# Patient Record
Sex: Male | Born: 1985 | Race: Black or African American | Hispanic: No | Marital: Married | Smoking: Never smoker
Health system: Southern US, Community
[De-identification: ages and names within clinical notes are randomized; demographics above are authoritative.]

## PROBLEM LIST (undated history)

## (undated) ENCOUNTER — Ambulatory Visit: Admission: EM | Payer: 59

## (undated) DIAGNOSIS — K219 Gastro-esophageal reflux disease without esophagitis: Secondary | ICD-10-CM

## (undated) DIAGNOSIS — J45909 Unspecified asthma, uncomplicated: Secondary | ICD-10-CM

## (undated) DIAGNOSIS — T7840XA Allergy, unspecified, initial encounter: Secondary | ICD-10-CM

## (undated) HISTORY — DX: Gastro-esophageal reflux disease without esophagitis: K21.9

## (undated) HISTORY — DX: Allergy, unspecified, initial encounter: T78.40XA

---
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
## 2006-04-12 ENCOUNTER — Emergency Department: Payer: Self-pay | Admitting: General Practice

## 2006-06-16 ENCOUNTER — Emergency Department: Payer: Self-pay | Admitting: Emergency Medicine

## 2006-06-16 ENCOUNTER — Other Ambulatory Visit: Payer: Self-pay

## 2006-06-29 ENCOUNTER — Emergency Department: Payer: Self-pay | Admitting: General Practice

## 2006-10-01 ENCOUNTER — Emergency Department: Payer: Self-pay | Admitting: Emergency Medicine

## 2007-01-28 ENCOUNTER — Emergency Department: Payer: Self-pay | Admitting: Emergency Medicine

## 2007-08-03 ENCOUNTER — Emergency Department: Payer: Self-pay | Admitting: Emergency Medicine

## 2007-08-07 ENCOUNTER — Emergency Department: Payer: Self-pay | Admitting: Emergency Medicine

## 2007-09-30 ENCOUNTER — Emergency Department: Payer: Self-pay | Admitting: Unknown Physician Specialty

## 2007-11-18 ENCOUNTER — Emergency Department: Payer: Self-pay

## 2007-11-22 ENCOUNTER — Emergency Department: Payer: Self-pay | Admitting: Emergency Medicine

## 2007-12-14 ENCOUNTER — Emergency Department: Payer: Self-pay | Admitting: Emergency Medicine

## 2008-04-29 ENCOUNTER — Emergency Department: Payer: Self-pay | Admitting: Emergency Medicine

## 2009-10-03 ENCOUNTER — Emergency Department: Payer: Self-pay | Admitting: Emergency Medicine

## 2010-02-20 ENCOUNTER — Emergency Department: Payer: Self-pay | Admitting: Emergency Medicine

## 2010-09-23 ENCOUNTER — Emergency Department: Payer: Self-pay | Admitting: Emergency Medicine

## 2010-09-30 ENCOUNTER — Emergency Department: Payer: Self-pay | Admitting: Emergency Medicine

## 2011-04-29 ENCOUNTER — Emergency Department: Payer: Self-pay | Admitting: Emergency Medicine

## 2011-05-23 ENCOUNTER — Emergency Department: Payer: Self-pay | Admitting: Internal Medicine

## 2011-12-03 ENCOUNTER — Emergency Department: Payer: Self-pay | Admitting: Emergency Medicine

## 2012-06-02 ENCOUNTER — Emergency Department: Payer: Self-pay | Admitting: Emergency Medicine

## 2012-06-02 LAB — URINALYSIS, COMPLETE
Bilirubin,UR: NEGATIVE
Glucose,UR: NEGATIVE mg/dL (ref 0–75)
Ketone: NEGATIVE
Leukocyte Esterase: NEGATIVE
Ph: 5 (ref 4.5–8.0)
RBC,UR: 1 /HPF (ref 0–5)
Squamous Epithelial: NONE SEEN
WBC UR: 4 /HPF (ref 0–5)

## 2012-06-06 ENCOUNTER — Emergency Department: Payer: Self-pay | Admitting: *Deleted

## 2012-09-19 ENCOUNTER — Emergency Department: Payer: Self-pay | Admitting: Emergency Medicine

## 2012-09-21 LAB — BETA STREP CULTURE(ARMC)

## 2013-03-18 ENCOUNTER — Emergency Department: Payer: Self-pay | Admitting: Emergency Medicine

## 2013-07-03 ENCOUNTER — Emergency Department: Payer: Self-pay | Admitting: Emergency Medicine

## 2013-07-09 ENCOUNTER — Emergency Department: Payer: Self-pay | Admitting: Emergency Medicine

## 2013-07-14 ENCOUNTER — Emergency Department: Payer: Self-pay | Admitting: Emergency Medicine

## 2013-08-09 ENCOUNTER — Emergency Department: Payer: Self-pay | Admitting: Emergency Medicine

## 2013-08-25 ENCOUNTER — Emergency Department: Payer: Self-pay | Admitting: Emergency Medicine

## 2013-09-24 ENCOUNTER — Emergency Department: Payer: Self-pay | Admitting: Emergency Medicine

## 2013-10-31 ENCOUNTER — Emergency Department: Payer: Self-pay | Admitting: Emergency Medicine

## 2013-11-20 ENCOUNTER — Emergency Department: Payer: Self-pay | Admitting: Internal Medicine

## 2013-11-29 ENCOUNTER — Emergency Department: Payer: Self-pay | Admitting: Emergency Medicine

## 2013-12-02 ENCOUNTER — Emergency Department: Payer: Self-pay | Admitting: Emergency Medicine

## 2013-12-21 ENCOUNTER — Emergency Department: Payer: Self-pay | Admitting: Emergency Medicine

## 2014-01-02 ENCOUNTER — Emergency Department: Payer: Self-pay | Admitting: Emergency Medicine

## 2014-01-13 ENCOUNTER — Emergency Department: Payer: Self-pay | Admitting: Emergency Medicine

## 2014-02-04 ENCOUNTER — Emergency Department: Payer: Self-pay | Admitting: Emergency Medicine

## 2014-02-08 ENCOUNTER — Emergency Department: Payer: Self-pay | Admitting: Emergency Medicine

## 2014-02-11 LAB — BETA STREP CULTURE(ARMC)

## 2014-02-25 ENCOUNTER — Emergency Department: Payer: Self-pay | Admitting: Emergency Medicine

## 2014-03-16 ENCOUNTER — Emergency Department: Payer: Self-pay | Admitting: Emergency Medicine

## 2014-04-23 ENCOUNTER — Emergency Department: Payer: Self-pay | Admitting: Emergency Medicine

## 2014-06-21 ENCOUNTER — Emergency Department: Payer: Self-pay | Admitting: Internal Medicine

## 2014-08-01 ENCOUNTER — Emergency Department: Payer: Self-pay | Admitting: Emergency Medicine

## 2014-10-20 ENCOUNTER — Emergency Department: Payer: Self-pay | Admitting: Emergency Medicine

## 2014-10-23 ENCOUNTER — Emergency Department: Payer: Self-pay | Admitting: Emergency Medicine

## 2014-12-13 ENCOUNTER — Emergency Department: Payer: Self-pay | Admitting: Student

## 2014-12-15 ENCOUNTER — Emergency Department: Payer: Self-pay | Admitting: Emergency Medicine

## 2015-01-04 ENCOUNTER — Emergency Department: Payer: Self-pay | Admitting: Emergency Medicine

## 2015-01-16 ENCOUNTER — Emergency Department: Payer: Self-pay | Admitting: Emergency Medicine

## 2015-02-28 ENCOUNTER — Emergency Department
Admission: EM | Admit: 2015-02-28 | Discharge: 2015-03-01 | Disposition: A | Discharge status: Home / Self Care | Payer: Self-pay | Attending: Emergency Medicine | Admitting: Emergency Medicine

## 2015-02-28 DIAGNOSIS — B349 Viral infection, unspecified: Secondary | ICD-10-CM | POA: Insufficient documentation

## 2015-02-28 DIAGNOSIS — J029 Acute pharyngitis, unspecified: Secondary | ICD-10-CM

## 2015-02-28 MED ORDER — PREDNISONE 20 MG PO TABS
60.0000 mg | ORAL_TABLET | Freq: Once | ORAL | Status: AC
  Administered 2015-03-01: 60 mg via ORAL

## 2015-02-28 MED ORDER — KETOROLAC TROMETHAMINE 10 MG PO TABS
10.0000 mg | ORAL_TABLET | Freq: Once | ORAL | Status: AC
  Administered 2015-03-01: 10 mg via ORAL

## 2015-02-28 MED ORDER — HYDROXYZINE HCL 50 MG PO TABS
50.0000 mg | ORAL_TABLET | Freq: Once | ORAL | Status: AC
  Administered 2015-03-01: 50 mg via ORAL

## 2015-02-28 MED ORDER — LORATADINE-PSEUDOEPHEDRINE ER 5-120 MG PO TB12: 1.0000 | ORAL_TABLET | Freq: Two times a day (BID) | ORAL | Status: DC

## 2015-02-28 MED ORDER — HYDROXYZINE HCL 25 MG PO TABS: 25.0000 mg | ORAL_TABLET | Freq: Three times a day (TID) | ORAL | Status: DC | PRN

## 2015-02-28 MED ORDER — PREDNISONE 20 MG PO TABS: 40.0000 mg | ORAL_TABLET | Freq: Every day | ORAL | Status: DC

## 2015-02-28 NOTE — ED Notes (Addendum)
Patient ambulatory to triage with steady gait, without difficulty or distress noted; pt reports bit by spider 2 days ago; now with sneezing and swelling to nose, "feel like I got a cold"; pt denies seeing a spider but st "my fiance seen a bumb on my back and squeezed the pus out and it had two spots like fangs"; no area noted on back where pt indicates

## 2015-02-28 NOTE — ED Provider Notes (Signed)
CSN: [id number]     Arrival date & time 02/28/15  2201 History   First MD Initiated Contact with Patient 02/28/15 2242     Chief Complaint  Patient presents with  . Nasal Congestion     (Consider location/radiation/quality/duration/timing/severity/associated sxs/prior Treatment) HPI  history of present illness on this patient reveals that he was bitten by a spider 2 days ago is now having what he believes to be unrelated sneezing sinus pain and drainage states he feels like he has a cold thinks it may be his allergies denies fever chills nausea vomiting diarrhea or any other associated signs or symptoms nothing making anything better or worse  No past medical history on file. No past surgical history on file. No family history on file. History  Substance Use Topics  . Smoking status: Not on file  . Smokeless tobacco: Not on file  . Alcohol Use: Not on file    Review of Systems  Review of systems negative 6 systems are reviewed the patient's upper noted in history of present illness denies nausea vomiting diarrhea urinary symptoms skin rash anxiety or distress    Allergies  Review of patient's allergies indicates not on file.  Home Medications   Prior to Admission medications   Medication Sig Start Date End Date Taking? Authorizing Provider  hydrOXYzine (ATARAX/VISTARIL) 25 MG tablet Take 1 tablet (25 mg total) by mouth every 8 (eight) hours as needed. 02/28/15   Ranee Peasley William C Makilah Dowda, PA-C  loratadine-pseudoephedrine (CLARITIN-D 12 HOUR) 5-120 MG per tablet Take 1 tablet by mouth 2 (two) times daily. 02/28/15   Glayds Insco William C Cuinn Westerhold, PA-C  predniSONE (DELTASONE) 20 MG tablet Take 2 tablets (40 mg total) by mouth daily. 02/28/15 02/28/16  Jencarlos Nicolson William C Estella Malatesta, PA-C   BP 140/85 mmHg  Pulse 87  Temp(Src) 97.7 F (36.5 C) (Oral)  Ht 6' (1.829 m)  Wt 204 lb (92.534 kg)  BMI 27.66 kg/m2  SpO2 100% Physical Exam  Male appearing stated well-developed well-nourished in no acute  distress  Is as noted ago exam reveals frontal maxillary sinus tenderness postnasal drainage Pupils equal round reactive light accommodation extraocular motions are intact Cardiovascular regular rate and rhythm no murmurs rubs gallops pulmonary lungs clear to auscultation bilaterally Skin unremarkable for rash warm dry and intact Neuro exams nonfocal Psychiatric patient is acting appropriately  ED Course  Procedures   ED course patient was given antihistamines and steroids will be discharged home follow up with his doctor as needed MDM   Final diagnoses:  Viral syndrome  Pharyngitis        Wilkes Potvin Rosalyn GessWilliam C Zylie Mumaw, PA-C 02/28/15 2314  Sharyn CreamerMark Quale, MD 03/04/15 1656

## 2015-03-01 MED ORDER — HYDROXYZINE HCL 25 MG PO TABS
ORAL_TABLET | ORAL | Status: AC
  Administered 2015-03-01: 50 mg via ORAL
  Filled 2015-03-01: qty 2

## 2015-03-01 MED ORDER — PREDNISONE 20 MG PO TABS
ORAL_TABLET | ORAL | Status: AC
  Filled 2015-03-01: qty 3

## 2015-03-01 MED ORDER — KETOROLAC TROMETHAMINE 10 MG PO TABS
ORAL_TABLET | ORAL | Status: AC
  Administered 2015-03-01: 10 mg via ORAL
  Filled 2015-03-01: qty 1

## 2015-05-26 DIAGNOSIS — R220 Localized swelling, mass and lump, head: Secondary | ICD-10-CM | POA: Insufficient documentation

## 2015-05-26 DIAGNOSIS — T360X5A Adverse effect of penicillins, initial encounter: Secondary | ICD-10-CM | POA: Insufficient documentation

## 2015-05-26 NOTE — ED Notes (Signed)
Patient reports taken 3 doses of penicillin prescribed by dentist and now his nose has swollen and running.  Also reports "weak" feeling.  Patient alert and oriented x3, no rash or respiratory distress noted in triage.

## 2015-05-27 ENCOUNTER — Emergency Department
Admission: EM | Admit: 2015-05-27 | Discharge: 2015-05-27 | Discharge status: Against Medical Advice | Payer: Self-pay | Attending: Emergency Medicine | Admitting: Emergency Medicine

## 2015-11-02 ENCOUNTER — Emergency Department
Admission: EM | Admit: 2015-11-02 | Discharge: 2015-11-03 | Disposition: A | Discharge status: Home / Self Care | Payer: BLUE CROSS/BLUE SHIELD | Attending: Emergency Medicine | Admitting: Emergency Medicine

## 2015-11-02 ENCOUNTER — Emergency Department: Payer: BLUE CROSS/BLUE SHIELD

## 2015-11-02 DIAGNOSIS — S56521A Laceration of other extensor muscle, fascia and tendon at forearm level, right arm, initial encounter: Secondary | ICD-10-CM

## 2015-11-02 DIAGNOSIS — Z7952 Long term (current) use of systemic steroids: Secondary | ICD-10-CM | POA: Diagnosis not present

## 2015-11-02 DIAGNOSIS — S61214A Laceration without foreign body of right ring finger without damage to nail, initial encounter: Secondary | ICD-10-CM | POA: Insufficient documentation

## 2015-11-02 DIAGNOSIS — S61212A Laceration without foreign body of right middle finger without damage to nail, initial encounter: Secondary | ICD-10-CM | POA: Diagnosis not present

## 2015-11-02 DIAGNOSIS — Z79899 Other long term (current) drug therapy: Secondary | ICD-10-CM | POA: Insufficient documentation

## 2015-11-02 DIAGNOSIS — Y998 Other external cause status: Secondary | ICD-10-CM | POA: Diagnosis not present

## 2015-11-02 DIAGNOSIS — Z23 Encounter for immunization: Secondary | ICD-10-CM | POA: Diagnosis not present

## 2015-11-02 DIAGNOSIS — Z880 Allergy status to penicillin: Secondary | ICD-10-CM | POA: Insufficient documentation

## 2015-11-02 DIAGNOSIS — S56427A Laceration of extensor muscle, fascia and tendon of right little finger at forearm level, initial encounter: Secondary | ICD-10-CM | POA: Diagnosis not present

## 2015-11-02 DIAGNOSIS — S61411A Laceration without foreign body of right hand, initial encounter: Secondary | ICD-10-CM

## 2015-11-02 DIAGNOSIS — S61210A Laceration without foreign body of right index finger without damage to nail, initial encounter: Secondary | ICD-10-CM | POA: Insufficient documentation

## 2015-11-02 DIAGNOSIS — Y9389 Activity, other specified: Secondary | ICD-10-CM | POA: Diagnosis not present

## 2015-11-02 DIAGNOSIS — S61219A Laceration without foreign body of unspecified finger without damage to nail, initial encounter: Secondary | ICD-10-CM

## 2015-11-02 DIAGNOSIS — Y92009 Unspecified place in unspecified non-institutional (private) residence as the place of occurrence of the external cause: Secondary | ICD-10-CM | POA: Insufficient documentation

## 2015-11-02 DIAGNOSIS — S56921A Laceration of unspecified muscles, fascia and tendons at forearm level, right arm, initial encounter: Secondary | ICD-10-CM

## 2015-11-02 DIAGNOSIS — W25XXXA Contact with sharp glass, initial encounter: Secondary | ICD-10-CM | POA: Insufficient documentation

## 2015-11-02 DIAGNOSIS — S51801A Unspecified open wound of right forearm, initial encounter: Secondary | ICD-10-CM

## 2015-11-02 MED ORDER — TETANUS-DIPHTH-ACELL PERTUSSIS 5-2.5-18.5 LF-MCG/0.5 IM SUSP
0.5000 mL | Freq: Once | INTRAMUSCULAR | Status: AC
  Administered 2015-11-03: 0.5 mL via INTRAMUSCULAR
  Filled 2015-11-02: qty 0.5

## 2015-11-02 MED ORDER — LIDOCAINE HCL (PF) 1 % IJ SOLN
5.0000 mL | Freq: Once | INTRAMUSCULAR | Status: AC
  Administered 2015-11-03: 5 mL
  Filled 2015-11-02: qty 5

## 2015-11-02 MED ORDER — TRAMADOL HCL 50 MG PO TABS
100.0000 mg | ORAL_TABLET | Freq: Once | ORAL | Status: DC
  Filled 2015-11-02: qty 2
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
## 2015-11-02 NOTE — ED Notes (Signed)
Pt in with lacerations x2  to hand with glass

## 2015-11-03 NOTE — Discharge Instructions (Signed)
Laceration Care, Adult °A laceration is a cut that goes through all layers of the skin. The cut also goes into the tissue that is right under the skin. Some cuts heal on their own. Others need to be closed with stitches (sutures), staples, skin adhesive strips, or wound glue. Taking care of your cut lowers your risk of infection and helps your cut to heal better. °HOW TO TAKE CARE OF YOUR CUT °For stitches or staples: °· Keep the wound clean and dry. °· If you were given a bandage (dressing), you should change it at least one time per day or as told by your doctor. You should also change it if it gets wet or dirty. °· Keep the wound completely dry for the first 24 hours or as told by your doctor. After that time, you may take a shower or a bath. However, make sure that the wound is not soaked in water until after the stitches or staples have been removed. °· Clean the wound one time each day or as told by your doctor: °· Wash the wound with soap and water. °· Rinse the wound with water until all of the soap comes off. °· Pat the wound dry with a clean towel. Do not rub the wound. °· After you clean the wound, put a thin layer of antibiotic ointment on it as told by your doctor. This ointment: °· Helps to prevent infection. °· Keeps the bandage from sticking to the wound. °· Have your stitches or staples removed as told by your doctor. °If your doctor used skin adhesive strips:  °· Keep the wound clean and dry. °· If you were given a bandage, you should change it at least one time per day or as told by your doctor. You should also change it if it gets dirty or wet. °· Do not get the skin adhesive strips wet. You can take a shower or a bath, but be careful to keep the wound dry. °· If the wound gets wet, pat it dry with a clean towel. Do not rub the wound. °· Skin adhesive strips fall off on their own. You can trim the strips as the wound heals. Do not remove any strips that are still stuck to the wound. They will  fall off after a while. °If your doctor used wound glue: °· Try to keep your wound dry, but you may briefly wet it in the shower or bath. Do not soak the wound in water, such as by swimming. °· After you take a shower or a bath, gently pat the wound dry with a clean towel. Do not rub the wound. °· Do not do any activities that will make you really sweaty until the skin glue has fallen off on its own. °· Do not apply liquid, cream, or ointment medicine to your wound while the skin glue is still on. °· If you were given a bandage, you should change it at least one time per day or as told by your doctor. You should also change it if it gets dirty or wet. °· If a bandage is placed over the wound, do not let the tape for the bandage touch the skin glue. °· Do not pick at the glue. The skin glue usually stays on for 5-10 days. Then, it falls off of the skin. °General Instructions  °· To help prevent scarring, make sure to cover your wound with sunscreen whenever you are outside after stitches are removed, after adhesive strips are removed,   or when wound glue stays in place and the wound is healed. Make sure to wear a sunscreen of at least 30 SPF. °· Take over-the-counter and prescription medicines only as told by your doctor. °· If you were given antibiotic medicine or ointment, take or apply it as told by your doctor. Do not stop using the antibiotic even if your wound is getting better. °· Do not scratch or pick at the wound. °· Keep all follow-up visits as told by your doctor. This is important. °· Check your wound every day for signs of infection. Watch for: °¨ Redness, swelling, or pain. °¨ Fluid, blood, or pus. °· Raise (elevate) the injured area above the level of your heart while you are sitting or lying down, if possible. °GET HELP IF: °· You got a tetanus shot and you have any of these problems at the injection site: °¨ Swelling. °¨ Very bad pain. °¨ Redness. °¨ Bleeding. °· You have a fever. °· A wound that was  closed breaks open. °· You notice a bad smell coming from your wound or your bandage. °· You notice something coming out of the wound, such as wood or glass. °· Medicine does not help your pain. °· You have more redness, swelling, or pain at the site of your wound. °· You have fluid, blood, or pus coming from your wound. °· You notice a change in the color of your skin near your wound. °· You need to change the bandage often because fluid, blood, or pus is coming from the wound. °· You start to have a new rash. °· You start to have numbness around the wound. °GET HELP RIGHT AWAY IF: °· You have very bad swelling around the wound. °· Your pain suddenly gets worse and is very bad. °· You notice painful lumps near the wound or on skin that is anywhere on your body. °· You have a red streak going away from your wound. °· The wound is on your hand or foot and you cannot move a finger or toe like you usually can. °· The wound is on your hand or foot and you notice that your fingers or toes look pale or bluish. °  °This information is not intended to replace advice given to you by your health care provider. Make sure you discuss any questions you have with your health care provider. °  °Document Released: 03/25/2008 Document Revised: 02/21/2015 Document Reviewed: 10/03/2014 °Elsevier Interactive Patient Education ©2016 Elsevier Inc. °Tendon Injury °Tendons are strong, cordlike structures that connect muscle to bone. Tendons are made up of woven fibers, like a rope. A tendon injury is a tear (rupture) of the tendon. The rupture may be partial (only a few of the fibers in your tendon rupture) or complete (your entire tendon ruptures). °CAUSES  °Tendon injuries can be caused by high-stress activities, such as sports. They also can be caused by a repetitive injury or by a single injury from an excessive, rapid force. °SYMPTOMS  °Symptoms of tendon injury include pain when you move the joint close to the tendon. Other symptoms  are swelling, redness, and warmth. °DIAGNOSIS  °Tendon injuries often can be diagnosed by physical exam. However, sometimes an X-ray exam or advanced imaging, such as magnetic resonance imaging (MRI), is necessary to determine the extent of the injury. °TREATMENT  °Partial tendon ruptures often can be treated with immobilization. A splint, bandage, or removable brace usually is used to immobilize the injured tendon. Most injured tendons need to   to be immobilized for 1-2 months before they are completely healed. Complete tendon ruptures may require surgical reattachment.   This information is not intended to replace advice given to you by your health care provider. Make sure you discuss any questions you have with your health care provider.   Document Released: 11/14/2004 Document Revised: 09/26/2011 Document Reviewed: 12/29/2011 Elsevier Interactive Patient Education [employer]2016 Elsevier Inc.   Keep the wounds clean, dry, and covered. [facility] and re-evaluation of the tendon injury in 2-3 days. [facility] in 10-days.

## 2015-11-14 ENCOUNTER — Ambulatory Visit: Payer: BLUE CROSS/BLUE SHIELD | Admitting: Anesthesiology

## 2015-11-14 ENCOUNTER — Encounter: Payer: Self-pay | Admitting: *Deleted

## 2015-11-14 ENCOUNTER — Ambulatory Visit
Admission: RE | Admit: 2015-11-14 | Discharge: 2015-11-14 | Disposition: A | Discharge status: Home / Self Care | Payer: BLUE CROSS/BLUE SHIELD | Source: Ambulatory Visit | Attending: Surgery | Admitting: Surgery

## 2015-11-14 ENCOUNTER — Encounter
Admission: RE | Disposition: A | Discharge status: Home / Self Care | Payer: Self-pay | Source: Ambulatory Visit | Attending: Surgery

## 2015-11-14 DIAGNOSIS — Z8349 Family history of other endocrine, nutritional and metabolic diseases: Secondary | ICD-10-CM | POA: Insufficient documentation

## 2015-11-14 DIAGNOSIS — S56427A Laceration of extensor muscle, fascia and tendon of right little finger at forearm level, initial encounter: Secondary | ICD-10-CM | POA: Insufficient documentation

## 2015-11-14 DIAGNOSIS — W1802XA Striking against glass with subsequent fall, initial encounter: Secondary | ICD-10-CM | POA: Diagnosis not present

## 2015-11-14 DIAGNOSIS — Z881 Allergy status to other antibiotic agents status: Secondary | ICD-10-CM | POA: Diagnosis not present

## 2015-11-14 DIAGNOSIS — S56423A Laceration of extensor muscle, fascia and tendon of right middle finger at forearm level, initial encounter: Secondary | ICD-10-CM | POA: Diagnosis present

## 2015-11-14 DIAGNOSIS — Z880 Allergy status to penicillin: Secondary | ICD-10-CM | POA: Insufficient documentation

## 2015-11-14 DIAGNOSIS — Z79899 Other long term (current) drug therapy: Secondary | ICD-10-CM | POA: Diagnosis not present

## 2015-11-14 DIAGNOSIS — Z91018 Allergy to other foods: Secondary | ICD-10-CM | POA: Insufficient documentation

## 2015-11-14 DIAGNOSIS — Z8249 Family history of ischemic heart disease and other diseases of the circulatory system: Secondary | ICD-10-CM | POA: Insufficient documentation

## 2015-11-14 DIAGNOSIS — Z820 Family history of epilepsy and other diseases of the nervous system: Secondary | ICD-10-CM | POA: Insufficient documentation

## 2015-11-14 DIAGNOSIS — Z882 Allergy status to sulfonamides status: Secondary | ICD-10-CM | POA: Insufficient documentation

## 2015-11-14 DIAGNOSIS — J302 Other seasonal allergic rhinitis: Secondary | ICD-10-CM | POA: Insufficient documentation

## 2015-11-14 HISTORY — PX: REPAIR EXTENSOR TENDON: SHX5382

## 2015-11-14 SURGERY — REPAIR, TENDON, EXTENSOR
Anesthesia: General | Site: Hand | Laterality: Right | Wound class: Clean

## 2015-11-14 MED ORDER — CLINDAMYCIN PHOSPHATE 900 MG/50ML IV SOLN
INTRAVENOUS | Status: AC
  Administered 2015-11-14: 900 mg via INTRAVENOUS
  Filled 2015-11-14: qty 50

## 2015-11-14 MED ORDER — FENTANYL CITRATE (PF) 100 MCG/2ML IJ SOLN
25.0000 ug | INTRAMUSCULAR | Status: DC | PRN
  Administered 2015-11-14 (×2): 25 ug via INTRAVENOUS

## 2015-11-14 MED ORDER — BUPIVACAINE HCL (PF) 0.5 % IJ SOLN
INTRAMUSCULAR | Status: DC | PRN
  Administered 2015-11-14: 10 mL

## 2015-11-14 MED ORDER — LIDOCAINE HCL (CARDIAC) 20 MG/ML IV SOLN
INTRAVENOUS | Status: DC | PRN
  Administered 2015-11-14: 100 mg via INTRAVENOUS

## 2015-11-14 MED ORDER — LACTATED RINGERS IV SOLN
INTRAVENOUS | Status: DC
  Administered 2015-11-14: 10:00:00 via INTRAVENOUS

## 2015-11-14 MED ORDER — DEXAMETHASONE SODIUM PHOSPHATE 10 MG/ML IJ SOLN
INTRAMUSCULAR | Status: DC | PRN
  Administered 2015-11-14: 10 mg via INTRAVENOUS

## 2015-11-14 MED ORDER — MIDAZOLAM HCL 5 MG/5ML IJ SOLN
INTRAMUSCULAR | Status: DC | PRN
  Administered 2015-11-14: 2 mg via INTRAVENOUS

## 2015-11-14 MED ORDER — ONDANSETRON HCL 4 MG/2ML IJ SOLN
INTRAMUSCULAR | Status: DC | PRN
  Administered 2015-11-14: 4 mg via INTRAVENOUS

## 2015-11-14 MED ORDER — FENTANYL CITRATE (PF) 100 MCG/2ML IJ SOLN
INTRAMUSCULAR | Status: DC | PRN
  Administered 2015-11-14 (×2): 50 ug via INTRAVENOUS

## 2015-11-14 MED ORDER — CLINDAMYCIN PHOSPHATE 900 MG/50ML IV SOLN: 900.0000 mg | Freq: Once | INTRAVENOUS | Status: DC

## 2015-11-14 MED ORDER — ONDANSETRON HCL 4 MG/2ML IJ SOLN: 4.0000 mg | Freq: Once | INTRAMUSCULAR | Status: DC | PRN

## 2015-11-14 MED ORDER — NEOMYCIN-POLYMYXIN B GU 40-200000 IR SOLN
Status: AC
  Filled 2015-11-14: qty 2

## 2015-11-14 MED ORDER — PROPOFOL 10 MG/ML IV BOLUS
INTRAVENOUS | Status: DC | PRN
  Administered 2015-11-14: 250 mg via INTRAVENOUS

## 2015-11-14 MED ORDER — OXYCODONE HCL 5 MG PO TABS: 5.0000 mg | ORAL_TABLET | ORAL | Status: DC | PRN

## 2015-11-14 MED ORDER — PHENYLEPHRINE HCL 10 MG/ML IJ SOLN
INTRAMUSCULAR | Status: DC | PRN
  Administered 2015-11-14: 50 ug via INTRAVENOUS
  Administered 2015-11-14: 100 ug via INTRAVENOUS

## 2015-11-14 MED ORDER — FENTANYL CITRATE (PF) 100 MCG/2ML IJ SOLN
INTRAMUSCULAR | Status: AC
  Administered 2015-11-14: 25 ug via INTRAVENOUS
  Filled 2015-11-14: qty 2

## 2015-11-14 MED ORDER — NEOMYCIN-POLYMYXIN B GU IR SOLN
Status: DC | PRN
  Administered 2015-11-14: 2 mL

## 2015-11-14 MED ORDER — BUPIVACAINE HCL (PF) 0.5 % IJ SOLN
INTRAMUSCULAR | Status: AC
  Filled 2015-11-14: qty 30

## 2015-11-14 SURGICAL SUPPLY — 34 items
BANDAGE ELASTIC 4 CLIP NS LF (GAUZE/BANDAGES/DRESSINGS) ×3 IMPLANT
BNDG ESMARK 4X12 TAN STRL LF (GAUZE/BANDAGES/DRESSINGS) ×3 IMPLANT
CHLORAPREP W/TINT 26ML (MISCELLANEOUS) ×3 IMPLANT
CORD BIP STRL DISP 12FT (MISCELLANEOUS) ×3 IMPLANT
ELECT CAUTERY BLADE 6.4 (BLADE) ×3 IMPLANT
FORCEPS JEWEL BIP 4-3/4 STR (INSTRUMENTS) ×3 IMPLANT
GAUZE PETRO XEROFOAM 1X8 (MISCELLANEOUS) ×3 IMPLANT
GAUZE SPONGE 4X4 12PLY STRL (GAUZE/BANDAGES/DRESSINGS) ×3 IMPLANT
GLOVE BIO SURGEON STRL SZ8 (GLOVE) ×6 IMPLANT
GLOVE INDICATOR 8.0 STRL GRN (GLOVE) ×3 IMPLANT
GOWN STRL REUS W/ TWL LRG LVL3 (GOWN DISPOSABLE) ×1 IMPLANT
GOWN STRL REUS W/ TWL XL LVL3 (GOWN DISPOSABLE) ×1 IMPLANT
GOWN STRL REUS W/TWL LRG LVL3 (GOWN DISPOSABLE) ×3
GOWN STRL REUS W/TWL XL LVL3 (GOWN DISPOSABLE) ×3
KIT RM TURNOVER STRD PROC AR (KITS) ×3 IMPLANT
NDL SAFETY 25GX1.5 (NEEDLE) ×3 IMPLANT
NS IRRIG 500ML POUR BTL (IV SOLUTION) ×3 IMPLANT
NYLON LOOK IMPLANT
PACK EXTREMITY ARMC (MISCELLANEOUS) ×3 IMPLANT
PAD CAST CTTN 4X4 STRL (SOFTGOODS) ×1 IMPLANT
PAD GROUND ADULT SPLIT (MISCELLANEOUS) ×3 IMPLANT
PADDING CAST COTTON 4X4 STRL (SOFTGOODS) ×3
SPLINT CAST 1 STEP 3X12 (MISCELLANEOUS) ×2 IMPLANT
SPLINT CAST 1 STEP 4X15 (MISCELLANEOUS) ×1 IMPLANT
STOCKINETTE 48X4 2 PLY STRL (GAUZE/BANDAGES/DRESSINGS) ×1 IMPLANT
STOCKINETTE STRL 4IN 9604848 (GAUZE/BANDAGES/DRESSINGS) ×3 IMPLANT
SUT ETHILON 4 0 P 3 18 (SUTURE) ×1 IMPLANT
SUT ETHILON 5 0 P 3 18 (SUTURE)
SUT MERSILENE 4-0 WHT RB-1 (SUTURE) ×5 IMPLANT
SUT NYLON 2-0 (SUTURE) ×2 IMPLANT
SUT NYLON ETHILON 5-0 P-3 1X18 (SUTURE) ×1 IMPLANT
SUT PROLENE 4 0 PS 2 18 (SUTURE) ×5 IMPLANT
SUT PROLENE 5 0 RB 1 DA (SUTURE) ×2 IMPLANT
SYRINGE 10CC LL (SYRINGE) ×3 IMPLANT

## 2015-11-14 NOTE — H&P (Signed)
Paper H&P to be scanned into permanent record. H&P reviewed. No changes. 

## 2015-11-14 NOTE — Anesthesia Procedure Notes (Signed)
Procedures

## 2015-11-14 NOTE — Transfer of Care (Signed)
Immediate Anesthesia Transfer of Care Note  Patient: Bruce Woodard  Procedure(s) Performed: Procedure(s): REPAIR EXTENSOR TENDON, 3rd and 5th (Right)  Patient Location: PACU  Anesthesia Type:General  Level of Consciousness: sedated  Airway & Oxygen Therapy: Patient Spontanous Breathing  Post-op Assessment: Report given to RN  Post vital signs: Reviewed and stable  Last Vitals:  Filed Vitals:   11/14/15 0755 11/14/15 1153  BP: 126/89 99/55  Pulse: 91 78  Temp: 35.9 C 98.31F  Resp: 16 18    Complications: No apparent anesthesia complications
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
## 2015-11-14 NOTE — Anesthesia Postprocedure Evaluation (Signed)
Anesthesia Post Note  Patient: Bruce Woodard  Procedure(s) Performed: Procedure(s) (LRB): REPAIR EXTENSOR TENDON, 3rd and 5th (Right)  Patient location during evaluation: PACU Anesthesia Type: General Level of consciousness: awake Pain management: pain level controlled Vital Signs Assessment: post-procedure vital signs reviewed and stable Respiratory status: spontaneous breathing Cardiovascular status: blood pressure returned to baseline Anesthetic complications: no    Last Vitals:  Filed Vitals:   11/14/15 1301 11/14/15 1318  BP: 116/62 105/58  Pulse:    Temp:    Resp: 16 16    Last Pain:  Filed Vitals:   11/14/15 1319  PainSc: 0-No pain                 Jaki Hammerschmidt S

## 2015-11-14 NOTE — Discharge Instructions (Addendum)
Keep splint dry and intact. Keep hand elevated above heart level. Apply ice to affected area frequently. Return for follow-up in 10-14 days or as scheduled.  General Anesthesia, Adult, Care After Refer to this sheet in the next few weeks. These instructions provide you with information on caring for yourself after your procedure. Your health care provider may also give you more specific instructions. Your treatment has been planned according to current medical practices, but problems sometimes occur. Call your health care provider if you have any problems or questions after your procedure. WHAT TO EXPECT AFTER THE PROCEDURE After the procedure, it is typical to experience:  Sleepiness.  Nausea and vomiting. HOME CARE INSTRUCTIONS  For the first 24 hours after general anesthesia:  Have a responsible person with you.  Do not drive a car. If you are alone, do not take public transportation.  Do not drink alcohol.  Do not take medicine that has not been prescribed by your health care provider.  Do not sign important papers or make important decisions.  You may resume a normal diet and activities as directed by your health care provider.  Change bandages (dressings) as directed.  If you have questions or problems that seem related to general anesthesia, call the hospital and ask for the anesthetist or anesthesiologist on call. SEEK MEDICAL CARE IF:  You have nausea and vomiting that continue the day after anesthesia.  You develop a rash. SEEK IMMEDIATE MEDICAL CARE IF:   You have difficulty breathing.  You have chest pain.  You have any allergic problems.   This information is not intended to replace advice given to you by your health care provider. Make sure you discuss any questions you have with your health care provider.   Document Released: 01/13/2001 Document Revised: 10/28/2014 Document Reviewed: 02/05/2012 Elsevier Interactive Patient Education [employer].
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
## 2015-11-15 ENCOUNTER — Encounter: Payer: Self-pay | Admitting: Surgery

## 2015-11-23 DIAGNOSIS — S61419A Laceration without foreign body of unspecified hand, initial encounter: Secondary | ICD-10-CM | POA: Insufficient documentation

## 2017-01-10 ENCOUNTER — Encounter: Payer: Self-pay | Admitting: Emergency Medicine

## 2017-01-10 ENCOUNTER — Emergency Department
Admission: EM | Admit: 2017-01-10 | Discharge: 2017-01-10 | Disposition: A | Discharge status: Home / Self Care | Payer: BLUE CROSS/BLUE SHIELD | Attending: Emergency Medicine | Admitting: Emergency Medicine

## 2017-01-10 ENCOUNTER — Emergency Department: Payer: BLUE CROSS/BLUE SHIELD

## 2017-01-10 DIAGNOSIS — R0789 Other chest pain: Secondary | ICD-10-CM | POA: Diagnosis not present

## 2017-01-10 DIAGNOSIS — Z79899 Other long term (current) drug therapy: Secondary | ICD-10-CM | POA: Diagnosis not present

## 2017-01-10 DIAGNOSIS — J45909 Unspecified asthma, uncomplicated: Secondary | ICD-10-CM | POA: Insufficient documentation

## 2017-01-10 DIAGNOSIS — R059 Cough, unspecified: Secondary | ICD-10-CM

## 2017-01-10 DIAGNOSIS — R05 Cough: Secondary | ICD-10-CM | POA: Insufficient documentation

## 2017-01-10 HISTORY — DX: Unspecified asthma, uncomplicated: J45.909

## 2017-01-10 MED ORDER — PREDNISONE 10 MG (21) PO TBPK: ORAL_TABLET | ORAL | 0 refills | Status: DC

## 2017-01-10 MED ORDER — IPRATROPIUM-ALBUTEROL 0.5-2.5 (3) MG/3ML IN SOLN
3.0000 mL | Freq: Once | RESPIRATORY_TRACT | Status: AC
  Administered 2017-01-10: 3 mL via RESPIRATORY_TRACT
  Filled 2017-01-10: qty 3

## 2017-01-10 MED ORDER — ALBUTEROL SULFATE HFA 108 (90 BASE) MCG/ACT IN AERS: 2.0000 | INHALATION_SPRAY | Freq: Four times a day (QID) | RESPIRATORY_TRACT | 0 refills | Status: DC | PRN

## 2017-01-10 NOTE — ED Triage Notes (Signed)
Pt reports cough for the past 2 days, worse at night, hx of bronchitis, feels the same. Using OTC meds and allergy meds, no relief.

## 2017-01-10 NOTE — ED Notes (Signed)
See triage note  States he developed cough couple of days ago   No fever  States cough is non prod. But states he feels like something is in left lung   Sx's are worse at night

## 2017-01-10 NOTE — ED Provider Notes (Signed)
[facility] Emergency Department Provider Note  ____________________________________________  Time seen: Approximately 5:40 PM  I have reviewed the triage vital signs and the nursing notes.   HISTORY  Chief Complaint Cough    HPI Bruce Woodard is a 31 y.o. male that presents to the emergency department with 2 days of chest tightness while breathing and non productive cough.Patient states that he feels like something is in his left lung. Patient has taken Mucinex for symptoms. Patient states that this happened last year and was told he has bronchitis and was given albuterol inhaler. Patient states that this feels the exact same as when it happened in the past. 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 He had asthma as a child but grew out of it. Patient does not smoke. Patient recently started working out again. He denies fever, shortness of breath, chest pain, nausea, vomiting, abdominal pain, diarrhea, constipation.   Past Medical History:  Diagnosis Date  . Asthma    as a child    There are no active problems to display for this patient.   Past Surgical History:  Procedure Laterality Date  . REPAIR EXTENSOR TENDON Right 11/14/2015   Procedure: REPAIR EXTENSOR TENDON, 3rd and 5th;  Surgeon: Christena Flake, MD;  Location: [facility] ORS;  Service: Orthopedics;  Laterality: Right;    Prior to Admission medications   Medication Sig Start Date End Date Taking? Authorizing Provider  cetirizine (ZYRTEC) 10 MG tablet Take 10 mg by mouth daily.   Yes Historical Provider, MD  albuterol (PROVENTIL HFA;VENTOLIN HFA) 108 (90 Base) MCG/ACT inhaler Inhale 2 puffs into the lungs every 6 (six) hours as needed for wheezing or shortness of breath. 01/10/17   Enid Derry, PA-C  predniSONE (STERAPRED UNI-PAK 21 TAB) 10 MG (21) TBPK tablet Take 6 tablets on day 1, take 5 tablets on day 2, take 4 tablets  on day 3, take 3 tablets on day 4, take 2 tablets on day 5, take 1 tablet on day 6 01/10/17   Enid Derry, PA-C    Allergies Amoxicillin  History reviewed. No pertinent family history.  Social History Social History  Substance Use Topics  . Smoking status: Never Smoker  . Smokeless tobacco: Never Used  . Alcohol use Yes     Review of Systems  Constitutional: No fever/chills ENT: Negative for congestion and rhinorrhea. Cardiovascular: No chest pain. Respiratory: Positive for cough. No SOB. Gastrointestinal: No abdominal pain.  No nausea, no vomiting.  No diarrhea.  No constipation. Musculoskeletal: Negative for musculoskeletal pain. Skin: Negative for rash, abrasions, lacerations, ecchymosis. Neurological: Negative for headaches.   ____________________________________________   PHYSICAL EXAM:  VITAL SIGNS: ED Triage Vitals [01/10/17 1700]  Enc Vitals Group     BP 135/80     Pulse Rate 87     Resp 18     Temp 98.7 F (37.1 C)     Temp Source Oral     SpO2 98 %     Weight 170 lb (77.1 kg)     Height 5\' 11"  (1.803 m)     Head Circumference      Peak Flow      Pain Score 8     Pain Loc      Pain Edu?      Excl. in GC?      Constitutional: Alert and oriented. Well appearing and in no acute distress.  Eyes: Conjunctivae are normal. PERRL. EOMI. No discharge. Head: Atraumatic. ENT:       Ears:       Nose: No congestion/rhinnorhea.      Mouth/Throat: Mucous membranes are moist. Oropharynx non-erythematous.  Neck: No stridor.   Hematological/Lymphatic/Immunilogical: No cervical lymphadenopathy. Cardiovascular: Normal rate, regular rhythm.  Good peripheral circulation. Respiratory: Normal respiratory effort without tachypnea or retractions. Lungs CTAB. Good air entry to the bases with no decreased or absent breath sounds. Gastrointestinal: Bowel sounds 4 quadrants. Soft and nontender to palpation. No guarding or rigidity. No palpable masses. No  distention. Musculoskeletal: Full range of motion to all extremities. No gross deformities appreciated. Neurologic:  Normal speech and language. No gross focal neurologic deficits are appreciated.    ____________________________________________   LABS (all labs ordered are listed, but only abnormal results are displayed)  Labs Reviewed - No data to display ____________________________________________  EKG   ____________________________________________  RADIOLOGY Lexine Baton, personally viewed and evaluated these images (plain radiographs) as part of my medical decision making, as well as reviewing the written report by the radiologist.  Dg Chest 2 View  Result Date: 01/10/2017 CLINICAL DATA:  Cough. EXAM: CHEST  2 VIEW COMPARISON:  None. FINDINGS: The heart size and mediastinal contours are within normal limits. Both lungs are clear. No pneumothorax or pleural effusion is noted. The visualized skeletal structures are unremarkable. IMPRESSION: No active cardiopulmonary disease. Electronically Signed   By: Lupita Raider, M.D.   On: 01/10/2017 17:50    ____________________________________________    PROCEDURES  Procedure(s) performed:    Procedures    Medications  ipratropium-albuterol (DUONEB) 0.5-2.5 (3) MG/3ML nebulizer solution 3 mL (3 mLs Nebulization Given 01/10/17 1743)     ____________________________________________   INITIAL IMPRESSION / ASSESSMENT AND PLAN / ED COURSE  Pertinent labs & imaging results that were available during my care of the patient were reviewed by me and considered in my medical decision making (see chart for details).  Review of the [place] CSRS was performed in accordance of the [facility] prior to dispensing any controlled drugs.   Patient presented to the emergency department with 2 days of cough and chest tightness while breathing. Vital signs and exam are reassuring.  X-ray negative for acute processes. Patient given DuoNeb in ED and  felt better. Patient feels comfortable going home. Patient will be discharged home with prescriptions for albuterol inhaler. Patient has a history of asthma and will be referred back to PCP for workup. Patient is given ED precautions to return to the ED for any worsening or new symptoms.     ____________________________________________  FINAL CLINICAL IMPRESSION(S) / ED DIAGNOSES  Final diagnoses:  Cough      NEW MEDICATIONS STARTED DURING THIS VISIT:  Discharge Medication List as of 01/10/2017  6:25 PM    START taking these medications   Details  albuterol (PROVENTIL HFA;VENTOLIN HFA) 108 (90 Base) MCG/ACT inhaler Inhale 2 puffs into the lungs every 6 (six) hours as needed for wheezing or shortness of breath., Starting Fri 01/10/2017, Print    predniSONE (STERAPRED UNI-PAK 21 TAB) 10 MG (21) TBPK tablet Take 6 tablets on day 1, take 5 tablets on day 2, take 4 tablets on day 3, take 3 tablets on day 4, take 2 tablets on day 5, take 1 tablet on day 6, Print            This chart was dictated using voice recognition software/Dragon. Despite best efforts to proofread, errors can occur which  can change the meaning. Any change was purely unintentional.    Enid DerryAshley Larysa Pall, PA-C 01/10/17 1846    Merrily BrittleNeil Rifenbark, MD 01/10/17 1910

## 2017-02-11 ENCOUNTER — Emergency Department
Admission: EM | Admit: 2017-02-11 | Discharge: 2017-02-11 | Disposition: A | Discharge status: Home / Self Care | Payer: BLUE CROSS/BLUE SHIELD | Attending: Emergency Medicine | Admitting: Emergency Medicine

## 2017-02-11 DIAGNOSIS — J029 Acute pharyngitis, unspecified: Secondary | ICD-10-CM | POA: Diagnosis not present

## 2017-02-11 DIAGNOSIS — J45909 Unspecified asthma, uncomplicated: Secondary | ICD-10-CM | POA: Insufficient documentation

## 2017-02-11 LAB — CHLAMYDIA/NGC RT PCR (ARMC ONLY)
CHLAMYDIA TR: NOT DETECTED
N GONORRHOEAE: NOT DETECTED

## 2017-02-11 LAB — POCT RAPID STREP A: STREPTOCOCCUS, GROUP A SCREEN (DIRECT): NEGATIVE

## 2017-02-11 MED ORDER — DEXAMETHASONE SODIUM PHOSPHATE 10 MG/ML IJ SOLN
10.0000 mg | Freq: Once | INTRAMUSCULAR | Status: AC
  Administered 2017-02-11: 10 mg via INTRAMUSCULAR
  Filled 2017-02-11: qty 1

## 2017-02-11 MED ORDER — DOXYCYCLINE HYCLATE 100 MG PO TABS
100.0000 mg | ORAL_TABLET | Freq: Once | ORAL | Status: AC
  Administered 2017-02-11: 100 mg via ORAL
  Filled 2017-02-11: qty 1

## 2017-02-11 NOTE — ED Notes (Signed)
Pt has a sore throat for 3 days.  Taking magic mouthwash without relief.  Pt also has right earache.  Pt alert.  Speech clear.

## 2017-02-11 NOTE — Discharge Instructions (Signed)
Take the antibiotic previously prescribed to you. You will be called with any positive culture results. Return to the ER for worsening symptoms, persistent vomiting, difficulty breathing or other concerns.

## 2017-02-11 NOTE — ED Notes (Signed)
Report off to rebecca rn 

## 2017-02-11 NOTE — ED Provider Notes (Signed)
[facility] Emergency Department Provider Note   ____________________________________________   First MD Initiated Contact with Patient 02/11/17 0214     (approximate)  I have reviewed the triage vital signs and the nursing notes.   HISTORY  Chief Complaint Sore Throat    HPI Bruce Woodard is a 31 y.o. male who presents to the ED from home with a chief complaint of sore throat. Patient reports a 3 day history of sore throat. He was seen at urgent care yesterday, given Magic mouthwash and a prescription for doxycycline. Patient was instructed not to take the antibiotic for 2 days unless he got worse. States Magic mouthwash is not controlling his pain. Also complains of right earache. His associated fever, chills, chest pain, shortness of breath, abdominal pain, nausea, vomiting, rash. Denies recent travel or trauma.   Past Medical History:  Diagnosis Date  . Asthma    as a child    There are no active problems to display for this patient.   Past Surgical History:  Procedure Laterality Date  . REPAIR EXTENSOR TENDON Right 11/14/2015   Procedure: REPAIR EXTENSOR TENDON, 3rd and 5th;  Surgeon: Christena Flake, MD;  Location: [facility] ORS;  Service: Orthopedics;  Laterality: Right;    Prior to Admission medications   Medication Sig Start Date End Date Taking? Authorizing Provider  albuterol (PROVENTIL HFA;VENTOLIN HFA) 108 (90 Base) MCG/ACT inhaler Inhale 2 puffs into the lungs every 6 (six) hours as needed for wheezing or shortness of breath. 01/10/17   Enid Derry, PA-C  cetirizine (ZYRTEC) 10 MG tablet Take 10 mg by mouth daily.    Historical Provider, MD  predniSONE (STERAPRED UNI-PAK 21 TAB) 10 MG (21) TBPK tablet Take 6 tablets on day 1, take 5 tablets on day 2, take 4 tablets on day 3, take 3 tablets on day 4, take 2 tablets on day 5, take 1 tablet on day 6 01/10/17   Enid Derry, PA-C    Allergies Amoxicillin  No family history on  file.  Social History Social History  Substance Use Topics  . Smoking status: Never Smoker  . Smokeless tobacco: Never Used  . Alcohol use Yes    Review of Systems  Constitutional: No fever/chills. Eyes: No visual changes. ENT: Positive for sore throat and right earache. Cardiovascular: Denies chest pain. Respiratory: Denies shortness of breath. Gastrointestinal: No abdominal pain.  No nausea, no vomiting.  No diarrhea.  No constipation. Genitourinary: Negative for dysuria. Musculoskeletal: Negative for back pain. Skin: Negative for rash. Neurological: Negative for headaches, focal weakness or numbness.   ____________________________________________   PHYSICAL EXAM:  VITAL SIGNS: ED Triage Vitals  Enc Vitals Group     BP 02/11/17 0013 131/82     Pulse Rate 02/11/17 0013 78     Resp 02/11/17 0013 16     Temp 02/11/17 0013 98.7 F (37.1 C)     Temp Source 02/11/17 0013 Oral     SpO2 02/11/17 0013 97 %     Weight 02/11/17 0014 170 lb (77.1 kg)     Height --      Head Circumference --      Peak Flow --      Pain Score --      Pain Loc --      Pain Edu? --      Excl. in GC? --     Constitutional: Alert and oriented. Well appearing and in no acute distress. Eyes: Conjunctivae are normal. PERRL.  EOMI. Head: Atraumatic. Ears: Bilateral TM dullness. Nose: No congestion/rhinnorhea. Mouth/Throat: Mucous membranes are moist.  Oropharynx erythematous with mild tonsillar swelling. No exudates or peritonsillar abscess. Mildly hoarse voice. There is no muffled voice or drooling. Neck: No stridor.  Supple neck without meningismus. Hematological/Lymphatic/Immunilogical: Shotty anterior cervical lymphadenopathy. Cardiovascular: Normal rate, regular rhythm. Grossly normal heart sounds.  Good peripheral circulation. Respiratory: Normal respiratory effort.  No retractions. Lungs CTAB. Gastrointestinal: Soft and nontender. No distention. No abdominal bruits. No CVA  tenderness. Musculoskeletal: No lower extremity tenderness nor edema.  No joint effusions. Neurologic:  Normal speech and language. No gross focal neurologic deficits are appreciated. No gait instability. Skin:  Skin is warm, dry and intact. No rash noted. Psychiatric: Mood and affect are normal. Speech and behavior are normal.  ____________________________________________   LABS (all labs ordered are listed, but only abnormal results are displayed)  Labs Reviewed  CULTURE, GROUP A STREP ([facility])  CHLAMYDIA/NGC RT PCR ([facility] ONLY)  POCT RAPID STREP A   ____________________________________________  EKG  None ____________________________________________  RADIOLOGY  None ____________________________________________   PROCEDURES  Procedure(s) performed: None  Procedures  Critical Care performed: No  ____________________________________________   INITIAL IMPRESSION / ASSESSMENT AND PLAN / ED COURSE  Pertinent labs & imaging results that were available during my care of the patient were reviewed by me and considered in my medical decision making (see chart for details).  31 year old male who presents with a 3 day history of sore throat. Rapid strep is negative. Will administer IM Decadron for mild tonsillar swelling. Instructed patient to fill his doxycycline prescription. Patient asking whether or not his sore throat could be secondary to STD. Girlfriend at bedside. Both deny active STD. Patient denies penile discharge. Girlfriend denies pelvic pain or vaginal discharge. Will swab patient's throat and send for GC/chlamydia at his request. Informed the patient he will be called with any positive culture results. Strict return precautions given. Both verbalize understanding and agree with plan of care.      ____________________________________________   FINAL CLINICAL IMPRESSION(S) / ED DIAGNOSES  Final diagnoses:  Sore throat  Pharyngitis, unspecified  etiology      NEW MEDICATIONS STARTED DURING THIS VISIT:  New Prescriptions   No medications on file     Note:  This document was prepared using Dragon voice recognition software and may include unintentional dictation errors.    Irean Hong, MD 02/11/17 [phone]

## 2017-02-11 NOTE — ED Triage Notes (Signed)
Pt ambulatory to triage with steady gait, no distress noted. Pt c/o of sore throat x3 days. Was seen at walk in clinic 02/10/17 and given magic mouth wash, symptoms unrelieved by medication.

## 2017-02-13 LAB — CULTURE, GROUP A STREP (THRC)

## 2017-09-25 ENCOUNTER — Ambulatory Visit
Admission: EM | Admit: 2017-09-25 | Discharge: 2017-09-25 | Disposition: A | Discharge status: Home / Self Care | Payer: BLUE CROSS/BLUE SHIELD | Attending: Family Medicine | Admitting: Family Medicine

## 2017-09-25 ENCOUNTER — Encounter: Payer: Self-pay | Admitting: Emergency Medicine

## 2017-09-25 ENCOUNTER — Other Ambulatory Visit: Payer: Self-pay

## 2017-09-25 DIAGNOSIS — R369 Urethral discharge, unspecified: Secondary | ICD-10-CM | POA: Diagnosis not present

## 2017-09-25 LAB — CHLAMYDIA/NGC RT PCR (ARMC ONLY)
CHLAMYDIA TR: NOT DETECTED
N GONORRHOEAE: NOT DETECTED

## 2017-09-25 NOTE — ED Provider Notes (Signed)
[facility]    CSN: [id number] Arrival date & time: 09/25/17  1644  History   Chief Complaint Chief Complaint  Patient presents with  . Penis Pain   HPI  31 year old male presents with penile drainage and pain of the scrotum/penis.  Patient reports that the symptoms started on Monday.  He states that he has had some soreness of his scrotum and the ventral aspect of his penis.  She reports clear drainage from his penis.  No dysuria.  No fever.  He reports sexual activity with only his fiance.  His fiance is present in the room.  No known exacerbating relieving factors.  No reports of lymphadenopathy.  No fever.  No other associated symptoms.  No other complaints or concerns at this time.  Past Medical History:  Diagnosis Date  . Asthma    as a child   Past Surgical History:  Procedure Laterality Date  . REPAIR EXTENSOR TENDON Right 11/14/2015   Procedure: REPAIR EXTENSOR TENDON, 3rd and 5th;  Surgeon: Christena FlakeJohn J Poggi, MD;  Location: [facility] ORS;  Service: Orthopedics;  Laterality: Right;    Home Medications    Family History Family History  Problem Relation Age of Onset  . Hypertension Mother   . Rheum arthritis Mother    Social History Social History   Tobacco Use  . Smoking status: Never Smoker  . Smokeless tobacco: Never Used  Substance Use Topics  . Alcohol use: Yes    Comment: socially  . Drug use: No   Allergies   Amoxicillin and Penicillins  Review of Systems Review of Systems  Constitutional: Negative.   Genitourinary: Positive for discharge.       Soreness of penis/scrotum.   Physical Exam Triage Vital Signs ED Triage Vitals [09/25/17 1702]  Enc Vitals Group     BP 122/83     Pulse Rate 67     Resp 16     Temp 98 F (36.7 C)     Temp Source Oral     SpO2 100 %     Weight 183 lb (83 kg)     Height 5\' 11"  (1.803 m)     Head Circumference      Peak Flow      Pain Score 0     Pain Loc      Pain Edu?      Excl. in GC?    Updated  Vital Signs BP 122/83 (BP Location: Left Arm)   Pulse 67   Temp 98 F (36.7 C) (Oral)   Resp 16   Ht 5\' 11"  (1.803 m)   Wt 183 lb (83 kg)   SpO2 100%   BMI 25.52 kg/m     Physical Exam  Constitutional: He is oriented to person, place, and time. He appears well-developed. No distress.  HENT:  Head: Normocephalic and atraumatic.  Nose: Nose normal.  Cardiovascular: Normal rate and regular rhythm.  No murmur heard. Pulmonary/Chest: Effort normal and breath sounds normal. He has no wheezes. He has no rales.  Genitourinary:  Genitourinary Comments: Clear drainage noted from the penis.  Penis appears normal.  No rash noted. Right inguinal lymph node noted.  Neurological: He is alert and oriented to person, place, and time.  Psychiatric: His behavior is normal.  Flat affect.  Vitals reviewed.  UC Treatments / Results  Labs (all labs ordered are listed, but only abnormal results are displayed) Labs Reviewed  CHLAMYDIA/NGC RT PCR ([facility] ONLY)  EKG  EKG Interpretation None       Radiology No results found.  Procedures Procedures (including critical care time)  Medications Ordered in UC Medications - No data to display   Initial Impression / Assessment and Plan / UC Course  I have reviewed the triage vital signs and the nursing notes.  Pertinent labs & imaging results that were available during my care of the patient were reviewed by me and considered in my medical decision making (see chart for details).     31 year old male presents with penile drainage.  Patient refused blood draw.  Sending urine for GC/ chlamydia.  Final Clinical Impressions(s) / UC Diagnoses   Final diagnoses:  Drainage from penis    ED Discharge Orders    None     Controlled Substance Prescriptions [place] Controlled Substance Registry consulted? Not Applicable   Tommie SamsCook, Medina Degraffenreid G, DO 09/25/17 1744

## 2017-09-25 NOTE — Discharge Instructions (Signed)
We will call with results.  Take care  Dr. Tarren Sabree  

## 2017-09-25 NOTE — ED Triage Notes (Signed)
Patient in today c/o pain on the back side of the penis x 3 days. Patient states that after he urinates a few minutes later he feels something wet on the end of his penis (clear). Unsure if it is urine or a discharge of something else. Patient denies urinary pain or frequency. Patient denies fever.

## 2017-10-01 ENCOUNTER — Encounter: Payer: Self-pay | Admitting: *Deleted

## 2017-10-01 ENCOUNTER — Ambulatory Visit
Admission: EM | Admit: 2017-10-01 | Discharge: 2017-10-01 | Disposition: A | Discharge status: Home / Self Care | Payer: BLUE CROSS/BLUE SHIELD | Attending: Family Medicine | Admitting: Family Medicine

## 2017-10-01 DIAGNOSIS — H1032 Unspecified acute conjunctivitis, left eye: Secondary | ICD-10-CM

## 2017-10-01 MED ORDER — MOXIFLOXACIN HCL 0.5 % OP SOLN: 1.0000 [drp] | Freq: Three times a day (TID) | OPHTHALMIC | 0 refills | Status: DC

## 2017-10-01 NOTE — ED Provider Notes (Signed)
[facility]    CSN: [id number] Arrival date & time: 10/01/17  1511     History   Chief Complaint Chief Complaint  Patient presents with  . Eye Problem    HPI Bruce Woodard is a 31 y.o. male.    Eye Problem  Location:  Left eye Quality:  Tearing Severity:  Moderate Onset quality:  Sudden Duration:  2 days Timing:  Constant Progression:  Worsening Chronicity:  New Context: not burn, not chemical exposure, not contact lens problem, not direct trauma, not foreign body, not using machinery, not scratch, not smoke exposure and not UV exposure   Relieved by:  Nothing Ineffective treatments:  Eye drops Associated symptoms: crusting, discharge and redness   Associated symptoms: no blurred vision, no decreased vision, no double vision, no facial rash, no headaches, no inflammation, no itching, no nausea, no numbness, no photophobia, no scotomas, no swelling, no tingling, no vomiting and no weakness   Risk factors: exposure to pinkeye   Risk factors: no conjunctival hemorrhage, no previous injury to eye, no recent herpes zoster and no recent URI     Past Medical History:  Diagnosis Date  . Asthma    as a child    There are no active problems to display for this patient.   Past Surgical History:  Procedure Laterality Date  . REPAIR EXTENSOR TENDON Right 11/14/2015   Procedure: REPAIR EXTENSOR TENDON, 3rd and 5th;  Surgeon: Christena FlakeJohn J Poggi, MD;  Location: [facility] ORS;  Woodard: Orthopedics;  Laterality: Right;       Home Medications    Prior to Admission medications   Medication Sig Start Date End Date Taking? Authorizing Provider  moxifloxacin (VIGAMOX) 0.5 % ophthalmic solution Place 1 drop into the left eye 3 (three) times daily. 10/01/17   Payton Mccallumonty, Brien Lowe, MD    Family History Family History  Problem Relation Age of Onset  . Hypertension Mother   . Rheum arthritis Mother     Social History Social History   Tobacco Use  . Smoking status: Never  Smoker  . Smokeless tobacco: Never Used  Substance Use Topics  . Alcohol use: Yes    Comment: socially  . Drug use: No     Allergies   Amoxicillin and Penicillins   Review of Systems Review of Systems  Eyes: Positive for discharge and redness. Negative for blurred vision, double vision, photophobia and itching.  Gastrointestinal: Negative for nausea and vomiting.  Neurological: Negative for tingling, weakness, numbness and headaches.     Physical Exam Triage Vital Signs ED Triage Vitals  Enc Vitals Group     BP 10/01/17 1533 117/74     Pulse Rate 10/01/17 1533 70     Resp 10/01/17 1533 16     Temp 10/01/17 1533 98.4 F (36.9 C)     Temp Source 10/01/17 1533 Oral     SpO2 10/01/17 1533 100 %     Weight --      Height --      Head Circumference --      Peak Flow --      Pain Score 10/01/17 1534 0     Pain Loc --      Pain Edu? --      Excl. in GC? --    No data found.  Updated Vital Signs BP 117/74 (BP Location: Left Arm)   Pulse 70   Temp 98.4 F (36.9 C) (Oral)   Resp 16   SpO2 100%  Visual Acuity Right Eye Distance:   Left Eye Distance:   Bilateral Distance:    Right Eye Near:   Left Eye Near:    Bilateral Near:     Physical Exam  Constitutional: He appears well-developed and well-nourished. No distress.  Eyes: EOM are normal. Pupils are equal, round, and reactive to light. Left eye exhibits discharge. Left conjunctiva is injected.  Skin: He is not diaphoretic.  Nursing note and vitals reviewed.    UC Treatments / Results  Labs (all labs ordered are listed, but only abnormal results are displayed) Labs Reviewed - No data to display  EKG  EKG Interpretation None       Radiology No results found.  Procedures Procedures (including critical care time)  Medications Ordered in UC Medications - No data to display   Initial Impression / Assessment and Plan / UC Course  I have reviewed the triage vital signs and the nursing  notes.  Pertinent labs & imaging results that were available during my care of the patient were reviewed by me and considered in my medical decision making (see chart for details).       Final Clinical Impressions(s) / UC Diagnoses   Final diagnoses:  Acute bacterial conjunctivitis of left eye    ED Discharge Orders        Ordered    moxifloxacin (VIGAMOX) 0.5 % ophthalmic solution  3 times daily     10/01/17 1551     1. diagnosis reviewed with patient 2. rx as per orders above; reviewed possible side effects, interactions, risks and benefits  3. Recommend supportive treatment with cool compresses 4. Follow-up prn if symptoms worsen or don't improve   Controlled Substance Prescriptions [place] Controlled Substance Registry consulted? Not Applicable   Payton Mccallumonty, Lemon Whitacre, MD 10/01/17 [phone]

## 2017-10-01 NOTE — ED Triage Notes (Signed)
Patient started having left eye irritation 2 days ago. Left eye is visibly red.

## 2018-03-12 ENCOUNTER — Emergency Department: Payer: BLUE CROSS/BLUE SHIELD

## 2018-03-12 ENCOUNTER — Other Ambulatory Visit: Payer: Self-pay

## 2018-03-12 ENCOUNTER — Encounter: Payer: Self-pay | Admitting: Emergency Medicine

## 2018-03-12 ENCOUNTER — Emergency Department
Admission: EM | Admit: 2018-03-12 | Discharge: 2018-03-12 | Disposition: A | Discharge status: Home / Self Care | Payer: BLUE CROSS/BLUE SHIELD | Attending: Emergency Medicine | Admitting: Emergency Medicine

## 2018-03-12 DIAGNOSIS — J209 Acute bronchitis, unspecified: Secondary | ICD-10-CM | POA: Insufficient documentation

## 2018-03-12 DIAGNOSIS — R05 Cough: Secondary | ICD-10-CM | POA: Diagnosis present

## 2018-03-12 DIAGNOSIS — J069 Acute upper respiratory infection, unspecified: Secondary | ICD-10-CM | POA: Diagnosis not present

## 2018-03-12 DIAGNOSIS — B9789 Other viral agents as the cause of diseases classified elsewhere: Secondary | ICD-10-CM

## 2018-03-12 MED ORDER — ALBUTEROL SULFATE 108 (90 BASE) MCG/ACT IN AEPB: 1.0000 | INHALATION_SPRAY | RESPIRATORY_TRACT | 0 refills | Status: DC | PRN

## 2018-03-12 MED ORDER — BENZONATATE 100 MG PO CAPS: 100.0000 mg | ORAL_CAPSULE | Freq: Four times a day (QID) | ORAL | 0 refills | Status: DC | PRN

## 2018-03-12 MED ORDER — IPRATROPIUM-ALBUTEROL 0.5-2.5 (3) MG/3ML IN SOLN
3.0000 mL | Freq: Once | RESPIRATORY_TRACT | Status: AC
  Administered 2018-03-12: 3 mL via RESPIRATORY_TRACT
  Filled 2018-03-12: qty 3

## 2018-03-12 NOTE — Discharge Instructions (Signed)
It was a pleasure to take care of you today, and thank you for coming to our emergency department.  If you have any questions or concerns before leaving please ask the nurse to grab me and I'm more than happy to go through your aftercare instructions again.  If you were prescribed any opioid pain medication today such as Norco, Vicodin, Percocet, morphine, hydrocodone, or oxycodone please make sure you do not drive when you are taking this medication as it can alter your ability to drive safely.  If you have any concerns once you are home that you are not improving or are in fact getting worse before you can make it to your follow-up appointment, please do not hesitate to call 911 and come back for further evaluation.  Merrily Brittle, MD  Results for orders placed or performed during the hospital encounter of 09/25/17  Chlamydia/NGC rt PCR  Result Value Ref Range   Specimen source GC/Chlam URINE, RANDOM    Chlamydia Tr NOT DETECTED NOT DETECTED   N gonorrhoeae NOT DETECTED NOT DETECTED   Dg Chest 2 View  Result Date: 03/12/2018 CLINICAL DATA:  Cough for 1 week. EXAM: CHEST - 2 VIEW COMPARISON:  01/10/2017 FINDINGS: The cardiomediastinal contours are normal. The lungs are clear. Pulmonary vasculature is normal. No consolidation, pleural effusion, or pneumothorax. No acute osseous abnormalities are seen. IMPRESSION: Normal radiographs of the chest. Electronically Signed   By: Rubye Oaks M.D.   On: 03/12/2018 06:21

## 2018-03-12 NOTE — ED Triage Notes (Addendum)
Patient ambulatory to triage with steady gait, without difficulty or distress noted; pt reports nonprod cough since Friday and "pressure on my lung", sore throat with sinus drainage; taking OTC meds without relief

## 2018-03-12 NOTE — ED Provider Notes (Signed)
[facility] Emergency Department Provider Note  ____________________________________________   None    (approximate)  I have reviewed the triage vital signs and the nursing notes.   HISTORY  Chief Complaint Cough   HPI Bruce Woodard is a 32 y.o. male who self presents to the emergency department with several days of cough shortness of breath and sinus congestion.  He has been taking unknown over-the-counter medications with minimal relief.  He comes to the emergency department tonight because of shortness of breath was gradually progressive and now more severe.  It is currently moderate in severity.  He has sharp moderate severity nonradiating upper chest pain worse when coughing improved but not coughing.  Past Medical History:  Diagnosis Date  . Asthma    as a child    There are no active problems to display for this patient.   Past Surgical History:  Procedure Laterality Date  . REPAIR EXTENSOR TENDON Right 11/14/2015   Procedure: REPAIR EXTENSOR TENDON, 3rd and 5th;  Surgeon: Christena Flake, MD;  Location: [facility] ORS;  Service: Orthopedics;  Laterality: Right;    Prior to Admission medications   Medication Sig Start Date End Date Taking? Authorizing Provider  Albuterol Sulfate (PROAIR RESPICLICK) 108 (90 Base) MCG/ACT AEPB Inhale 1 puff into the lungs every 4 (four) hours as needed (cough). 03/12/18   Merrily Brittle, MD  benzonatate (TESSALON PERLES) 100 MG capsule Take 1 capsule (100 mg total) by mouth every 6 (six) hours as needed for cough. 03/12/18 03/12/19  Merrily Brittle, MD  moxifloxacin (VIGAMOX) 0.5 % ophthalmic solution Place 1 drop into the left eye 3 (three) times daily. 10/01/17   Payton Mccallum, MD    Allergies Amoxicillin and Penicillins  Family History  Problem Relation Age of Onset  . Hypertension Mother   . Rheum arthritis Mother     Social History Social History   Tobacco Use  . Smoking status: Never Smoker  .  Smokeless tobacco: Never Used  Substance Use Topics  . Alcohol use: Yes    Comment: socially  . Drug use: No    Review of Systems Constitutional: No fever/chills ENT: Positive for sore throat. Cardiovascular: Positive for chest pain. Respiratory: Positive for shortness of breath. Gastrointestinal: No abdominal pain.  No nausea, no vomiting.  No diarrhea.  No constipation. Musculoskeletal: Negative for back pain. Neurological: Negative for headaches   ____________________________________________   PHYSICAL EXAM:  VITAL SIGNS: ED Triage Vitals  Enc Vitals Group     BP 03/12/18 0559 129/89     Pulse Rate 03/12/18 0559 79     Resp 03/12/18 0559 18     Temp --      Temp src --      SpO2 03/12/18 0559 100 %     Weight 03/12/18 0556 175 lb (79.4 kg)     Height 03/12/18 0556  (1.803 m)     Head Circumference --      Peak Flow --      Pain Score 03/12/18 0556 6     Pain Loc --      Pain Edu? --      Excl. in GC? --     Constitutional: Alert and oriented x4 appears mildly short of breath nontoxic no diaphoresis speaks full clear sentences Head: Atraumatic. Nose: No congestion/rhinnorhea. Mouth/Throat: No trismus uvula midline no pharyngeal erythema or exudate Neck: No stridor.   Cardiovascular: Regular rate and rhythm Respiratory: Normal respiratory effort mild expiratory wheeze  in all fields moving good air Gastrointestinal: Soft nontender Neurologic:  Normal speech and language. No gross focal neurologic deficits are appreciated.  Skin:  Skin is warm, dry and intact. No rash noted.    ____________________________________________  LABS (all labs ordered are listed, but only abnormal results are displayed)  Labs Reviewed - No data to display   __________________________________________  EKG   ____________________________________________  RADIOLOGY  S x-ray reviewed by me with no acute  disease ____________________________________________   DIFFERENTIAL includes but not limited to  Upper respiratory tract infection, bronchitis, pneumonia, pneumothorax, pulmonary embolism, myocarditis, pericarditis   PROCEDURES  Procedure(s) performed: no  Procedures  Critical Care performed: no  Observation: no ____________________________________________   INITIAL IMPRESSION / ASSESSMENT AND PLAN / ED COURSE  Pertinent labs & imaging results that were available during my care of the patient were reviewed by me and considered in my medical decision making (see chart for details).  The patient arrives saturating well and hemodynamically stable.  His clinical exam is most consistent with viral bronchitis.  Given the duration of the symptoms x-ray obtained to evaluate for infiltrate and is fortunately negative.  Given a breathing treatment and he feels improved.  I will discharge him home with albuterol and primary care follow-up.  The patient verbalizes understanding and agree with plan.      ____________________________________________   FINAL CLINICAL IMPRESSION(S) / ED DIAGNOSES  Final diagnoses:  Viral URI with cough  Acute bronchitis, unspecified organism      NEW MEDICATIONS STARTED DURING THIS VISIT:  Discharge Medication List as of 03/12/2018  6:29 AM    START taking these medications   Details  Albuterol Sulfate (PROAIR RESPICLICK) 108 (90 Base) MCG/ACT AEPB Inhale 1 puff into the lungs every 4 (four) hours as needed (cough)., Starting Thu 03/12/2018, Print    benzonatate (TESSALON PERLES) 100 MG capsule Take 1 capsule (100 mg total) by mouth every 6 (six) hours as needed for cough., Starting Thu 03/12/2018, Until Fri 03/12/2019, Print         Note:  This document was prepared using Dragon voice recognition software and may include unintentional dictation errors.      Merrily Brittle, MD 03/16/18 1000

## 2018-07-27 ENCOUNTER — Emergency Department
Admission: EM | Admit: 2018-07-27 | Discharge: 2018-07-28 | Disposition: A | Discharge status: Home / Self Care | Payer: BLUE CROSS/BLUE SHIELD | Attending: Emergency Medicine | Admitting: Emergency Medicine

## 2018-07-27 ENCOUNTER — Encounter: Payer: Self-pay | Admitting: *Deleted

## 2018-07-27 ENCOUNTER — Other Ambulatory Visit: Payer: Self-pay

## 2018-07-27 ENCOUNTER — Emergency Department: Payer: BLUE CROSS/BLUE SHIELD

## 2018-07-27 DIAGNOSIS — J069 Acute upper respiratory infection, unspecified: Secondary | ICD-10-CM

## 2018-07-27 DIAGNOSIS — J45909 Unspecified asthma, uncomplicated: Secondary | ICD-10-CM | POA: Insufficient documentation

## 2018-07-27 DIAGNOSIS — R05 Cough: Secondary | ICD-10-CM | POA: Diagnosis present

## 2018-07-27 DIAGNOSIS — Z79899 Other long term (current) drug therapy: Secondary | ICD-10-CM | POA: Insufficient documentation

## 2018-07-27 NOTE — ED Triage Notes (Signed)
Pt reports a cough for 3 days. Nonproductive cough.  Non smoker .  No fever.  Pt alert.

## 2018-07-28 MED ORDER — ALBUTEROL SULFATE HFA 108 (90 BASE) MCG/ACT IN AERS: 2.0000 | INHALATION_SPRAY | Freq: Four times a day (QID) | RESPIRATORY_TRACT | 0 refills | Status: DC | PRN

## 2018-07-28 MED ORDER — BENZONATATE 100 MG PO CAPS: 100.0000 mg | ORAL_CAPSULE | Freq: Four times a day (QID) | ORAL | 0 refills | Status: DC | PRN

## 2018-07-28 NOTE — ED Provider Notes (Signed)
[facility] Emergency Department Provider Note  ___________________________________________   First MD Initiated Contact with Patient 07/27/18 2352     (approximate)  I have reviewed the triage vital signs and the nursing notes.   HISTORY  Chief Complaint Cough   HPI Bruce Woodard is a 32 y.o. male with a history of asthma who was presented to emergency department today with 3 to 4 days of nonproductive cough as well as nasal congestion.  He says that he had similar cough in the past and was diagnosed with bronchitis.  In the previous instance he was prescribed Tessalon as well as albuterol inhaler which she did improve the symptoms.  He denies any fever.  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  Does not report any sick contacts.  Says that he has taken DayQuil and that it has "dried him out" which has improved his symptoms.  Says that he has an appointment with his primary care doctor this coming Wednesday.  Patient denies smoking cigarettes.  Past Medical History:  Diagnosis Date  . Asthma    as a child    There are no active problems to display for this patient.   Past Surgical History:  Procedure Laterality Date  . REPAIR EXTENSOR TENDON Right 11/14/2015   Procedure: REPAIR EXTENSOR TENDON, 3rd and 5th;  Surgeon: Christena Flake, MD;  Location: [facility] ORS;  Service: Orthopedics;  Laterality: Right;    Prior to Admission medications   Medication Sig Start Date End Date Taking? Authorizing Provider  Albuterol Sulfate (PROAIR RESPICLICK) 108 (90 Base) MCG/ACT AEPB Inhale 1 puff into the lungs every 4 (four) hours as needed (cough). 03/12/18   Merrily Brittle, MD  benzonatate (TESSALON PERLES) 100 MG capsule Take 1 capsule (100 mg total) by mouth every 6 (six) hours as needed for cough. 03/12/18 03/12/19  Merrily Brittle, MD  moxifloxacin (VIGAMOX) 0.5 % ophthalmic  solution Place 1 drop into the left eye 3 (three) times daily. 10/01/17   Payton Mccallum, MD    Allergies Amoxicillin and Penicillins  Family History  Problem Relation Age of Onset  . Hypertension Mother   . Rheum arthritis Mother     Social History Social History   Tobacco Use  . Smoking status: Never Smoker  . Smokeless tobacco: Never Used  Substance Use Topics  . Alcohol use: Yes    Comment: socially  . Drug use: No    Review of Systems  Constitutional: No fever/chills Eyes: No visual changes. ENT: No sore throat. Cardiovascular: Denies chest pain. Respiratory: As above Gastrointestinal: No abdominal pain.  No nausea, no vomiting.  No diarrhea.   Genitourinary: Negative for dysuria. Musculoskeletal: Negative for back pain. Skin: Negative for rash. Neurological: Negative for headaches, focal weakness or numbness.   ____________________________________________   PHYSICAL EXAM:  VITAL SIGNS: ED Triage Vitals  Enc Vitals Group     BP 07/27/18 2237 124/60     Pulse Rate 07/27/18 2237 65     Resp 07/27/18 2237 20     Temp 07/27/18 2237 97.8 F (36.6 C)     Temp Source 07/27/18 2237 Oral     SpO2 07/27/18 2237 100 %     Weight 07/27/18 2238 185 lb (83.9 kg)     Height 07/27/18 2238 5\' 11"  (1.803 m)     Head Circumference --      Peak Flow --  Pain Score 07/27/18 2238 8     Pain Loc --      Pain Edu? --      Excl. in GC? --     Constitutional: Alert and oriented. Well appearing and in no acute distress. Eyes: Conjunctivae are normal.  Head: Atraumatic. Nose: No congestion/rhinnorhea. Mouth/Throat: Mucous membranes are moist.  Pharynx without any swelling, erythema or exudate. Neck: No stridor.   Cardiovascular: Normal rate, regular rhythm. Grossly normal heart sounds.  Respiratory: Normal respiratory effort.  No retractions. Lungs CTAB. Gastrointestinal: Soft and nontender. No distention. Musculoskeletal: No lower extremity tenderness nor edema.   No joint effusions. Neurologic:  Normal speech and language. No gross focal neurologic deficits are appreciated. Skin:  Skin is warm, dry and intact. No rash noted. Psychiatric: Mood and affect are normal. Speech and behavior are normal.  ____________________________________________   LABS (all labs ordered are listed, but only abnormal results are displayed)  Labs Reviewed - No data to display ____________________________________________  EKG   ____________________________________________  RADIOLOGY Chest x-ray without acute disease  ____________________________________________   PROCEDURES  Procedure(s) performed:   Procedures  Critical Care performed:   ____________________________________________   INITIAL IMPRESSION / ASSESSMENT AND PLAN / ED COURSE  Pertinent labs & imaging results that were available during my care of the patient were reviewed by me and considered in my medical decision making (see chart for details).  Differential includes, but is not limited to, viral syndrome, bronchitis including COPD exacerbation, pneumonia, reactive airway disease including asthma, CHF including exacerbation with or without pulmonary/interstitial edema, pneumothorax, ACS, thoracic trauma, and pulmonary embolism.  As part of my medical decision making, I reviewed the following data within the electronic MEDICAL RECORD NUMBER Notes from prior ED visits  PE RC negative.  Patient with benign appearance.  Will be discharged with similar treatment as previous and will follow-up with his primary care doctor this coming Wednesday. He is understanding of the diagnosis as well as treatment and willing to comply. ____________________________________________   FINAL CLINICAL IMPRESSION(S) / ED DIAGNOSES  URI  NEW MEDICATIONS STARTED DURING THIS VISIT:  New Prescriptions   No medications on file     Note:  This document was prepared using Dragon voice recognition software and  may include unintentional dictation errors.     Myrna Blazer, MD 07/28/18 [phone]

## 2018-07-28 NOTE — ED Notes (Signed)
ED Provider at bedside. 

## 2018-12-25 ENCOUNTER — Emergency Department
Admission: EM | Admit: 2018-12-25 | Discharge: 2018-12-25 | Disposition: A | Discharge status: Home / Self Care | Payer: BLUE CROSS/BLUE SHIELD | Attending: Emergency Medicine | Admitting: Emergency Medicine

## 2018-12-25 ENCOUNTER — Other Ambulatory Visit: Payer: Self-pay

## 2018-12-25 ENCOUNTER — Encounter: Payer: Self-pay | Admitting: Emergency Medicine

## 2018-12-25 DIAGNOSIS — J111 Influenza due to unidentified influenza virus with other respiratory manifestations: Secondary | ICD-10-CM | POA: Diagnosis not present

## 2018-12-25 DIAGNOSIS — R69 Illness, unspecified: Secondary | ICD-10-CM

## 2018-12-25 DIAGNOSIS — M791 Myalgia, unspecified site: Secondary | ICD-10-CM | POA: Diagnosis present

## 2018-12-25 DIAGNOSIS — R05 Cough: Secondary | ICD-10-CM | POA: Diagnosis not present

## 2018-12-25 DIAGNOSIS — R51 Headache: Secondary | ICD-10-CM | POA: Diagnosis not present

## 2018-12-25 DIAGNOSIS — R509 Fever, unspecified: Secondary | ICD-10-CM | POA: Diagnosis not present

## 2018-12-25 MED ORDER — KETOROLAC TROMETHAMINE 60 MG/2ML IM SOLN
60.0000 mg | Freq: Once | INTRAMUSCULAR | Status: DC
  Filled 2018-12-25: qty 2

## 2018-12-25 MED ORDER — ACETAMINOPHEN 325 MG PO TABS
650.0000 mg | ORAL_TABLET | Freq: Once | ORAL | Status: AC
Start: 2018-12-25 — End: 2018-12-25
  Administered 2018-12-25: 650 mg via ORAL
  Filled 2018-12-25: qty 2

## 2018-12-25 MED ORDER — OSELTAMIVIR PHOSPHATE 75 MG PO CAPS: 75.0000 mg | ORAL_CAPSULE | Freq: Two times a day (BID) | ORAL | 0 refills | Status: AC

## 2018-12-25 MED ORDER — PSEUDOEPH-BROMPHEN-DM 30-2-10 MG/5ML PO SYRP: 5.0000 mL | ORAL_SOLUTION | Freq: Four times a day (QID) | ORAL | 0 refills | Status: DC | PRN

## 2018-12-25 NOTE — Discharge Instructions (Signed)
Follow discharge care instructions.  Take ibuprofen at 600mg  every 6-8 hours or Tylenol 650 mg every 4-6 hours.

## 2018-12-25 NOTE — ED Triage Notes (Signed)
Pt here with c/o flu symptoms for the past few days, wife was diagnosed with flu 4 days ago, NAD.

## 2018-12-25 NOTE — ED Notes (Addendum)
Patient c/o cough, chills/sweats,body aches, headache and fever X2 days. His wife was diagnosed with flu at [place] X4 days ago. She is present with patient in room

## 2018-12-25 NOTE — ED Provider Notes (Signed)
[facility] Emergency Department Provider Note   ____________________________________________   First MD Initiated Contact with Patient 12/25/18 [phone]     (approximate)  I have reviewed the triage vital signs and the nursing notes.   HISTORY  Chief Complaint Headache; Chills; Cough; and Generalized Body Aches    HPI Bruce Woodard is a 33 y.o. male patient presents with flulike symptoms for 3 days.  Patient wife was diagnosed with flu 4 days ago.  Neither the patient nor his wife is taken flu shot for the series.  Wife is currently being treated with Tamiflu.  Patient body aches, fever, chills, and nonproductive cough.  Patient denies nausea, vomiting, diarrhea.  Patient took 200 mg of ibuprofen while waiting for the doctor in exam room.         Past Medical History:  Diagnosis Date  . Asthma    as a child    There are no active problems to display for this patient.   Past Surgical History:  Procedure Laterality Date  . REPAIR EXTENSOR TENDON Right 11/14/2015   Procedure: REPAIR EXTENSOR TENDON, 3rd and 5th;  Surgeon: Christena Flake, MD;  Location: [facility] ORS;  Service: Orthopedics;  Laterality: Right;    Prior to Admission medications   Medication Sig Start Date End Date Taking? Authorizing Provider  albuterol (PROVENTIL HFA;VENTOLIN HFA) 108 (90 Base) MCG/ACT inhaler Inhale 2 puffs into the lungs every 6 (six) hours as needed. 07/28/18   Myrna Blazer, MD  brompheniramine-pseudoephedrine-DM 30-2-10 MG/5ML syrup Take 5 mLs by mouth 4 (four) times daily as needed. 12/25/18   Joni Reining, PA-C  moxifloxacin (VIGAMOX) 0.5 % ophthalmic solution Place 1 drop into the left eye 3 (three) times daily. 10/01/17   Payton Mccallum, MD  oseltamivir (TAMIFLU) 75 MG capsule Take 1 capsule (75 mg total) by mouth 2 (two) times daily for 5 days. 12/25/18 12/30/18  Joni Reining, PA-C    Allergies Amoxicillin and Penicillins  Family History  Problem  Relation Age of Onset  . Hypertension Mother   . Rheum arthritis Mother     Social History Social History   Tobacco Use  . Smoking status: Never Smoker  . Smokeless tobacco: Never Used  Substance Use Topics  . Alcohol use: Yes    Comment: socially  . Drug use: No    Review of Systems Constitutional:  Fever/chills body aches. Eyes: No visual changes. ENT: No sore throat.  Nasal congestion. Cardiovascular: Denies chest pain.  Nonproductive cough. Respiratory: Denies shortness of breath. Gastrointestinal: No abdominal pain.  No nausea, no vomiting.  No diarrhea.  No constipation. Genitourinary: Negative for dysuria. Musculoskeletal: Negative for back pain. Skin: Negative for rash. Neurological: Negative for headaches, focal weakness or numbness. Allergic/Immunilogical: Penicillin. ____________________________________________   PHYSICAL EXAM:  VITAL SIGNS: ED Triage Vitals  Enc Vitals Group     BP 12/25/18 0922 115/70     Pulse Rate 12/25/18 0922 99     Resp 12/25/18 0922 16     Temp 12/25/18 0922 (!) 102 F (38.9 C)     Temp Source 12/25/18 0922 Oral     SpO2 12/25/18 0922 100 %     Weight 12/25/18 0921 187 lb (84.8 kg)     Height 12/25/18 0921 5\' 11"  (1.803 m)     Head Circumference --      Peak Flow --      Pain Score 12/25/18 0920 10     Pain Loc --  Pain Edu? --      Excl. in GC? --     Constitutional: Alert and oriented. Well appearing and in no acute distress.  Febrile. Eyes: Conjunctivae are normal. PERRL. EOMI. Head: Atraumatic. Nose: Edematous nasal turbinates with clear rhinorrhea.  Mouth/Throat: Mucous membranes are moist.  Oropharynx non-erythematous.  Nasal drainage. Neck: No stridor.  Hematological/Lymphatic/Immunilogical: No cervical lymphadenopathy. Cardiovascular: Normal rate, regular rhythm. Grossly normal heart sounds.  Good peripheral circulation. Respiratory: Normal respiratory effort.  No retractions. Lungs CTAB. Gastrointestinal:  Soft and nontender. No distention. No abdominal bruits. No CVA tenderness. Skin:  Skin is warm, dry and intact. No rash noted. Psychiatric: Mood and affect are normal. Speech and behavior are normal.  ____________________________________________   LABS (all labs ordered are listed, but only abnormal results are displayed)  Labs Reviewed - No data to display ____________________________________________  EKG   ____________________________________________  RADIOLOGY  ED MD interpretation:    Official radiology report(s): No results found.  ____________________________________________   PROCEDURES  Procedure(s) performed (including Critical Care):  Procedures   ____________________________________________   INITIAL IMPRESSION / ASSESSMENT AND PLAN / ED COURSE  As part of my medical decision making, I reviewed the following data within the electronic MEDICAL RECORD NUMBER         Patient presents for flulike symptoms with history of recent exposure by close family member.  Patient given discharge care and a school note.  Patient vies take medication as directed.  Patient advised follow-up PCP.      ____________________________________________   FINAL CLINICAL IMPRESSION(S) / ED DIAGNOSES  Final diagnoses:  Influenza-like illness     ED Discharge Orders         Ordered    oseltamivir (TAMIFLU) 75 MG capsule  2 times daily     12/25/18 1006    brompheniramine-pseudoephedrine-DM 30-2-10 MG/5ML syrup  4 times daily PRN     12/25/18 1006           Note:  This document was prepared using Dragon voice recognition software and may include unintentional dictation errors.    Joni Reining, PA-C 12/25/18 1019    Jene Every, MD 12/25/18 1236

## 2018-12-25 NOTE — ED Notes (Signed)
Pt took 2 of his own ibuprofen just now.

## 2019-01-30 ENCOUNTER — Emergency Department
Admission: EM | Admit: 2019-01-30 | Discharge: 2019-01-30 | Disposition: A | Discharge status: Home / Self Care | Payer: BLUE CROSS/BLUE SHIELD | Attending: Emergency Medicine | Admitting: Emergency Medicine

## 2019-01-30 DIAGNOSIS — R1310 Dysphagia, unspecified: Secondary | ICD-10-CM | POA: Diagnosis present

## 2019-01-30 DIAGNOSIS — J45909 Unspecified asthma, uncomplicated: Secondary | ICD-10-CM | POA: Diagnosis not present

## 2019-01-30 DIAGNOSIS — Z79899 Other long term (current) drug therapy: Secondary | ICD-10-CM | POA: Diagnosis not present

## 2019-01-30 DIAGNOSIS — T781XXA Other adverse food reactions, not elsewhere classified, initial encounter: Secondary | ICD-10-CM | POA: Diagnosis not present

## 2019-01-30 MED ORDER — PREDNISONE 20 MG PO TABS
60.0000 mg | ORAL_TABLET | Freq: Once | ORAL | Status: AC
  Administered 2019-01-30: 05:00:00 60 mg via ORAL
  Filled 2019-01-30: qty 3

## 2019-01-30 MED ORDER — EPINEPHRINE 0.3 MG/0.3ML IJ SOAJ: 0.3000 mg | INTRAMUSCULAR | 0 refills | Status: DC | PRN

## 2019-01-30 MED ORDER — PREDNISONE 20 MG PO TABS: 60.0000 mg | ORAL_TABLET | Freq: Every day | ORAL | 0 refills | Status: AC

## 2019-01-30 NOTE — ED Triage Notes (Signed)
Patient c/o allergic reaction that began approx 2 hours ago. Patient took 2 benadryl at onset (2 hours ago, and a third benadryl 30 minutes ago). Patient c/o throat tightness/itching. Patient is constantly clearing throat in triage.

## 2019-01-30 NOTE — ED Notes (Signed)

## 2019-01-30 NOTE — ED Provider Notes (Signed)
[facility] Emergency Department Provider Note ____________________________________________   First MD Initiated Contact with Patient 01/30/19 [phone]     (approximate)  I have reviewed the triage vital signs and the nursing notes.   HISTORY  Chief Complaint Allergic Reaction    HPI Bruce Woodard is a 33 y.o. male with PMH as noted below as well as a history of seasonal allergies who presents with symptoms which she believes are from an allergic reaction, acute onset about 1 hour ago, and characterized by tingling in his throat and some discomfort with swallowing.  It is associated with itching in his ears.  He states it has improved somewhat after he took Benadryl.  He denies associated hives or rash, and has no shortness of breath.  He states that it is similar to prior allergic reactions.  He ate some pretzels which he did not realize had tree nuts in them.  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  Past Medical History:  Diagnosis Date  . Asthma    as a child    There are no active problems to display for this patient.   Past Surgical History:  Procedure Laterality Date  . REPAIR EXTENSOR TENDON Right 11/14/2015   Procedure: REPAIR EXTENSOR TENDON, 3rd and 5th;  Surgeon: Christena FlakeJohn J Poggi, MD;  Location: [facility] ORS;  Service: Orthopedics;  Laterality: Right;    Prior to Admission medications   Medication Sig Start Date End Date Taking? Authorizing Provider  albuterol (PROVENTIL HFA;VENTOLIN HFA) 108 (90 Base) MCG/ACT inhaler Inhale 2 puffs into the lungs every 6 (six) hours as needed. 07/28/18   Myrna BlazerSchaevitz, David Matthew, MD  brompheniramine-pseudoephedrine-DM 30-2-10 MG/5ML syrup Take 5 mLs by mouth 4 (four) times daily as needed. 12/25/18   Joni ReiningSmith, Ronald K, PA-C  EPINEPHrine (EPIPEN 2-PAK) 0.3 mg/0.3 mL IJ SOAJ injection Inject 0.3 mLs (0.3 mg total) into the muscle as needed for up to 1 dose for anaphylaxis. 01/30/19   Dionne BucySiadecki, Juliauna Stueve, MD  moxifloxacin (VIGAMOX)  0.5 % ophthalmic solution Place 1 drop into the left eye 3 (three) times daily. 10/01/17   Payton Mccallumonty, [place], MD  predniSONE (DELTASONE) 20 MG tablet Take 3 tablets (60 mg total) by mouth daily with breakfast for 3 days. Start the day after the ER visit 01/31/19 02/03/19  Dionne BucySiadecki, Ysabelle Goodroe, MD    Allergies Amoxicillin; Grass extracts [gramineae pollens]; Peanut-containing drug products; Penicillins; Shrimp [shellfish allergy]; and Tomato  Family History  Problem Relation Age of Onset  . Hypertension Mother   . Rheum arthritis Mother     Social History Social History   Tobacco Use  . Smoking status: Never Smoker  . Smokeless tobacco: Never Used  Substance Use Topics  . Alcohol use: Yes    Comment: socially  . Drug use: No    Review of Systems  Constitutional: No fever/chills. Eyes: No redness. ENT: Positive for throat discomfort.  Negative for difficulty swallowing. Cardiovascular: Denies chest pain. Respiratory: Denies shortness of breath. Gastrointestinal: No vomiting or diarrhea.  Genitourinary: Negative for flank pain.  Musculoskeletal: Negative for back pain. Skin: Negative for rash. Neurological: Negative for headache.   ____________________________________________   PHYSICAL EXAM:  VITAL SIGNS: ED Triage Vitals [01/30/19 0415]  Enc Vitals Group     BP (!) 130/104     Pulse Rate 65     Resp 18     Temp 97.6 F (36.4 C)     Temp Source Oral     SpO2 100 %  Weight 180 lb (81.6 kg)     Height 5\' 11"  (1.803 m)     Head Circumference      Peak Flow      Pain Score 9     Pain Loc      Pain Edu?      Excl. in GC?     Constitutional: Alert and oriented. Well appearing and in no acute distress. Eyes: Conjunctivae are normal.  Head: Atraumatic. Nose: No congestion/rhinnorhea. Mouth/Throat: Mucous membranes are moist.  Oropharynx clear with no swelling or pooled secretions.  No stridor. Neck: Normal range of motion.  Cardiovascular: Normal rate, regular  rhythm. Good peripheral circulation. Respiratory: Normal respiratory effort.  No retractions. Gastrointestinal: No distention.  Musculoskeletal: Extremities warm and well perfused.  Neurologic:  Normal speech and language. No gross focal neurologic deficits are appreciated.  Skin:  Skin is warm and dry. No rash noted. Psychiatric: Mood and affect are normal. Speech and behavior are normal.  ____________________________________________   LABS (all labs ordered are listed, but only abnormal results are displayed)  Labs Reviewed - No data to display ____________________________________________  EKG   ____________________________________________  RADIOLOGY    ____________________________________________   PROCEDURES  Procedure(s) performed: No  Procedures  Critical Care performed: No ____________________________________________   INITIAL IMPRESSION / ASSESSMENT AND PLAN / ED COURSE  Pertinent labs & imaging results that were available during my care of the patient were reviewed by me and considered in my medical decision making (see chart for details).  33 year old male with PMH as noted above presents with symptoms consistent with an allergic reaction to tree nuts, with some discomfort in his throat and itching in his ears.  He has no shortness of breath, difficulty swallowing, stridor, pooled secretions, or hives.  On exam he is well-appearing and there is no visible swelling in his oropharynx or evidence of airway compromise.  His vital signs are normal.  Presentation is consistent with mild allergic reaction.  The patient already took Benadryl.  I will give prednisone here.  The patient states that he wants to go home as soon as the medicine is given because he does not want to be in the hospital when there is coronavirus.  Since he has no evidence of anaphylaxis or airway compromise, there is no indication for prolonged observation and he is stable for discharge home.   I will prescribe prednisone and an EpiPen.  Return precautions given, and he expresses understanding. ____________________________________________   FINAL CLINICAL IMPRESSION(S) / ED DIAGNOSES  Final diagnoses:  Allergic reaction to food, initial encounter      NEW MEDICATIONS STARTED DURING THIS VISIT:  New Prescriptions   EPINEPHRINE (EPIPEN 2-PAK) 0.3 MG/0.3 ML IJ SOAJ INJECTION    Inject 0.3 mLs (0.3 mg total) into the muscle as needed for up to 1 dose for anaphylaxis.   PREDNISONE (DELTASONE) 20 MG TABLET    Take 3 tablets (60 mg total) by mouth daily with breakfast for 3 days. Start the day after the ER visit     Note:  This document was prepared using Dragon voice recognition software and may include unintentional dictation errors.    Dionne Bucy, MD 01/30/19 [phone]

## 2019-01-30 NOTE — Discharge Instructions (Addendum)
Return to the ER for new, worsening, or persistent severe throat discomfort, difficulty swallowing, shortness of breath, hives or rash, or any other new or worsening symptoms that concern you.  Take the prednisone for the next few days starting tomorrow.  You can continue Benadryl up to every 4-6 hours as needed.

## 2019-01-30 NOTE — ED Notes (Signed)
Pt states eating "flips" about an hour ago and started feeling his throat tingling. Pt states this is a sign that warns him of an allergic reaction. Pt states taking Benadryl almost immediately, another dose about 30 minutes later, and another dose before coming to the ED. At this time, pt states his ears are itching and he feels his throat still tingling. Pt denies any pain at this time. Pt is on the stretcher, talking on the phone and does not appear to be in acute distress at this time. Pt is awake, alert and oriented.

## 2019-02-26 ENCOUNTER — Other Ambulatory Visit: Payer: Self-pay

## 2019-02-26 DIAGNOSIS — J029 Acute pharyngitis, unspecified: Secondary | ICD-10-CM | POA: Insufficient documentation

## 2019-02-26 DIAGNOSIS — K0889 Other specified disorders of teeth and supporting structures: Secondary | ICD-10-CM | POA: Diagnosis present

## 2019-02-26 DIAGNOSIS — Z5321 Procedure and treatment not carried out due to patient leaving prior to being seen by health care provider: Secondary | ICD-10-CM | POA: Diagnosis not present

## 2019-02-26 DIAGNOSIS — Z91010 Allergy to peanuts: Secondary | ICD-10-CM | POA: Insufficient documentation

## 2019-02-26 DIAGNOSIS — J45909 Unspecified asthma, uncomplicated: Secondary | ICD-10-CM | POA: Insufficient documentation

## 2019-02-26 NOTE — ED Triage Notes (Signed)
Patient states that he started taking prednisone on Monday for an allergic reaction. Patient states that he now has a sore to the left side of his tongue times two days.

## 2019-02-27 ENCOUNTER — Encounter: Payer: Self-pay | Admitting: Emergency Medicine

## 2019-02-27 ENCOUNTER — Emergency Department
Admission: EM | Admit: 2019-02-27 | Discharge: 2019-02-27 | Disposition: A | Discharge status: Home / Self Care | Payer: BLUE CROSS/BLUE SHIELD | Attending: Emergency Medicine | Admitting: Emergency Medicine

## 2019-02-27 ENCOUNTER — Other Ambulatory Visit: Payer: Self-pay

## 2019-02-27 ENCOUNTER — Emergency Department
Admission: EM | Admit: 2019-02-27 | Discharge: 2019-02-27 | Disposition: A | Discharge status: Home / Self Care | Payer: BLUE CROSS/BLUE SHIELD | Source: Home / Self Care | Attending: Emergency Medicine | Admitting: Emergency Medicine

## 2019-02-27 DIAGNOSIS — J029 Acute pharyngitis, unspecified: Secondary | ICD-10-CM | POA: Insufficient documentation

## 2019-02-27 DIAGNOSIS — J45909 Unspecified asthma, uncomplicated: Secondary | ICD-10-CM | POA: Insufficient documentation

## 2019-02-27 DIAGNOSIS — Z91010 Allergy to peanuts: Secondary | ICD-10-CM | POA: Insufficient documentation

## 2019-02-27 LAB — GROUP A STREP BY PCR: Group A Strep by PCR: NOT DETECTED

## 2019-02-27 MED ORDER — IBUPROFEN 800 MG PO TABS: 800.0000 mg | ORAL_TABLET | Freq: Three times a day (TID) | ORAL | 0 refills | Status: DC | PRN

## 2019-02-27 MED ORDER — LIDOCAINE VISCOUS HCL 2 % MT SOLN: 15.0000 mL | OROMUCOSAL | 0 refills | Status: DC | PRN

## 2019-02-27 NOTE — ED Notes (Signed)
Pt c/o white patch to L side of tongue and sore throat for past few days. States patch has went away but throat remains sore. Denies any other symptoms. States uses an inhaler but only albuterol. Lungs clear/dim.

## 2019-02-27 NOTE — ED Provider Notes (Signed)
[facility] Emergency Department Provider Note       Time seen: ----------------------------------------- 5:08 PM on 02/27/2019 -----------------------------------------   I have reviewed the triage vital signs and the nursing notes.  HISTORY   Chief Complaint Sore Throat    HPI Bruce Woodard is a 33 y.o. male with a history of asthma who presents to the ED for sore throat for the past 3 days.  Patient came last night but had to leave before being seen.  He denies fevers, chills or other complaints.  Past Medical History:  Diagnosis Date  . Asthma    as a child    There are no active problems to display for this patient.   Past Surgical History:  Procedure Laterality Date  . REPAIR EXTENSOR TENDON Right 11/14/2015   Procedure: REPAIR EXTENSOR TENDON, 3rd and 5th;  Surgeon: Christena Flake, MD;  Location: [facility] ORS;  Service: Orthopedics;  Laterality: Right;    Allergies Amoxicillin; Grass extracts [gramineae pollens]; Peanut-containing drug products; Penicillins; Shrimp [shellfish allergy]; and Tomato  Social History Social History   Tobacco Use  . Smoking status: Never Smoker  . Smokeless tobacco: Never Used  Substance Use Topics  . Alcohol use: Yes    Comment: socially  . Drug use: No    Review of Systems Constitutional: Negative for fever. HEENT: Positive for sore throat Cardiovascular: Negative for chest pain. Respiratory: Negative for shortness of breath. Musculoskeletal: Negative for back pain. Skin: Negative for rash. Neurological: Negative for headaches, focal weakness or numbness.  All systems negative/normal/unremarkable except as stated in the HPI  ____________________________________________   PHYSICAL EXAM:  VITAL SIGNS: ED Triage Vitals  Enc Vitals Group     BP 02/27/19 1704 120/81     Pulse Rate 02/27/19 1704 69     Resp 02/27/19 1704 16     Temp 02/27/19 1704 97.9 F (36.6 C)     Temp Source 02/27/19 1704  Oral     SpO2 02/27/19 1704 100 %     Weight --      Height --      Head Circumference --      Peak Flow --      Pain Score 02/27/19 1705 7     Pain Loc --      Pain Edu? --      Excl. in GC? --    Constitutional: Alert and oriented. Well appearing and in no distress. Eyes: Conjunctivae are normal. Normal extraocular movements. ENT      Head: Normocephalic and atraumatic.      Nose: No congestion/rhinnorhea.      Mouth/Throat: Mucous membranes are moist.  Mild erythema is noted, no tonsillar hypertrophy      Neck: No stridor.  No adenopathy is noted Cardiovascular: Normal rate, regular rhythm. No murmurs, rubs, or gallops. Respiratory: Normal respiratory effort without tachypnea nor retractions. Breath sounds are clear and equal bilaterally. No wheezes/rales/rhonchi. Musculoskeletal: Nontender with normal range of motion in extremities. No lower extremity tenderness nor edema. Skin:  Skin is warm, dry and intact. No rash noted. ____________________________________________  ED COURSE:  As part of my medical decision making, I reviewed the following data within the electronic MEDICAL RECORD NUMBER History obtained from family if available, nursing notes, old chart and ekg, as well as notes from prior ED visits. Patient presented for sore throat, we will assess with labs and imaging as indicated at this time.   Procedures  DWAYNE KLEINERT was evaluated in  Emergency Department on 02/27/2019 for the symptoms described in the history of present illness. He was evaluated in the context of the global COVID-19 pandemic, which necessitated consideration that the patient might be at risk for infection with the SARS-CoV-2 virus that causes COVID-19. Institutional protocols and algorithms that pertain to the evaluation of patients at risk for COVID-19 are in a state of rapid change based on information released by regulatory bodies including the CDC and federal and state organizations. These policies and  algorithms were followed during the patient's care in the ED.  ____________________________________________   LABS (pertinent positives/negatives)  Labs Reviewed  GROUP A STREP BY PCR   ____________________________________________   DIFFERENTIAL DIAGNOSIS   Pharyngitis, strep pharyngitis, GERD, postnasal drip  FINAL ASSESSMENT AND PLAN  Pharyngitis   Plan: The patient had presented for sore throat. Patient's labs are unremarkable.  He is cleared for outpatient follow-up as needed.   Ulice DashJohnathan E Williams, MD    Note: This note was generated in part or whole with voice recognition software. Voice recognition is usually quite accurate but there are transcription errors that can and very often do occur. I apologize for any typographical errors that were not detected and corrected.     Emily FilbertWilliams, Jonathan E, MD 02/27/19 [phone]

## 2019-02-27 NOTE — ED Triage Notes (Signed)
Pt to ED via POV c/o sore throat x 3 days. Pt came last night but left before being seen. Pt is in NAD.

## 2019-02-27 NOTE — ED Notes (Signed)
Unable to locate patient in waiting area, bathrooms or outside.

## 2019-05-22 ENCOUNTER — Other Ambulatory Visit: Payer: Self-pay

## 2019-05-22 DIAGNOSIS — Z5321 Procedure and treatment not carried out due to patient leaving prior to being seen by health care provider: Secondary | ICD-10-CM | POA: Diagnosis not present

## 2019-05-22 DIAGNOSIS — R42 Dizziness and giddiness: Secondary | ICD-10-CM | POA: Diagnosis present

## 2019-05-22 NOTE — ED Triage Notes (Signed)
Patient reports feeling lightheaded since Friday.  Reports had one episode of feeling short of breath that resolved quickly and on its own.

## 2019-05-23 ENCOUNTER — Emergency Department
Admission: EM | Admit: 2019-05-23 | Discharge: 2019-05-23 | Discharge status: Against Medical Advice | Payer: BC Managed Care – PPO | Attending: Emergency Medicine | Admitting: Emergency Medicine

## 2019-05-23 LAB — BASIC METABOLIC PANEL
Anion gap: 8 (ref 5–15)
BUN: 12 mg/dL (ref 6–20)
CO2: 27 mmol/L (ref 22–32)
Calcium: 9.3 mg/dL (ref 8.9–10.3)
Chloride: 105 mmol/L (ref 98–111)
Creatinine, Ser: 1.12 mg/dL (ref 0.61–1.24)
GFR calc Af Amer: >60 mL/min (ref >60)
GFR calc non Af Amer: >60 mL/min (ref >60)
Glucose, Bld: 98 mg/dL (ref 70–99)
Potassium: 3.3 mmol/L — ABNORMAL LOW (ref 3.5–5.1)
Sodium: 140 mmol/L (ref 135–145)

## 2019-05-23 LAB — CBC
HCT: 42.8 % (ref 39.0–52.0)
Hemoglobin: 15.1 g/dL (ref 13.0–17.0)
MCH: 31.6 pg (ref 26.0–34.0)
MCHC: 35.3 g/dL (ref 30.0–36.0)
MCV: 89.5 fL (ref 80.0–100.0)
Platelets: 319 10*3/uL (ref 150–400)
RBC: 4.78 MIL/uL (ref 4.22–5.81)
RDW: 12 % (ref 11.5–15.5)
WBC: 6.3 10*3/uL (ref 4.0–10.5)
nRBC: 0 % (ref 0.0–0.2)

## 2019-05-23 NOTE — ED Notes (Signed)
Patient called for room with no answer.  

## 2019-05-25 ENCOUNTER — Telehealth: Payer: Self-pay | Admitting: Emergency Medicine

## 2019-05-25 NOTE — Telephone Encounter (Signed)
Called patient due to lwot to inquire about condition and follow up plans. Says he is better, thinks it was where a family member had passed away.   I explained that his labwork Is available for his pcp to review, but he needs to call and tell them it is there.

## 2019-06-21 ENCOUNTER — Emergency Department
Admission: EM | Admit: 2019-06-21 | Discharge: 2019-06-21 | Disposition: A | Discharge status: Home / Self Care | Payer: BC Managed Care – PPO

## 2019-06-21 NOTE — ED Notes (Signed)
First call with no answer [phone].

## 2019-06-21 NOTE — ED Triage Notes (Signed)
Patient called x3 with no answer. Patient not visualized in lobby or outside.  

## 2019-07-08 ENCOUNTER — Emergency Department: Payer: BC Managed Care – PPO

## 2019-07-08 ENCOUNTER — Emergency Department
Admission: EM | Admit: 2019-07-08 | Discharge: 2019-07-08 | Disposition: A | Discharge status: Home / Self Care | Payer: BC Managed Care – PPO | Attending: Student in an Organized Health Care Education/Training Program | Admitting: Student in an Organized Health Care Education/Training Program

## 2019-07-08 ENCOUNTER — Other Ambulatory Visit: Payer: Self-pay

## 2019-07-08 DIAGNOSIS — N503 Cyst of epididymis: Secondary | ICD-10-CM | POA: Insufficient documentation

## 2019-07-08 DIAGNOSIS — J45909 Unspecified asthma, uncomplicated: Secondary | ICD-10-CM | POA: Insufficient documentation

## 2019-07-08 DIAGNOSIS — Z79899 Other long term (current) drug therapy: Secondary | ICD-10-CM | POA: Diagnosis not present

## 2019-07-08 DIAGNOSIS — Z91010 Allergy to peanuts: Secondary | ICD-10-CM | POA: Diagnosis not present

## 2019-07-08 DIAGNOSIS — N50812 Left testicular pain: Secondary | ICD-10-CM | POA: Diagnosis present

## 2019-07-08 LAB — URINALYSIS, COMPLETE (UACMP) WITH MICROSCOPIC
Bacteria, UA: NONE SEEN
Bilirubin Urine: NEGATIVE
Glucose, UA: NEGATIVE mg/dL
Hgb urine dipstick: NEGATIVE
Ketones, ur: NEGATIVE mg/dL
Leukocytes,Ua: NEGATIVE
Nitrite: NEGATIVE
Protein, ur: NEGATIVE mg/dL
Specific Gravity, Urine: 1.001 — ABNORMAL LOW (ref 1.005–1.030)
Squamous Epithelial / LPF: NONE SEEN (ref 0–5)
pH: 9 — ABNORMAL HIGH (ref 5.0–8.0)

## 2019-07-08 NOTE — ED Triage Notes (Signed)
Left sided testicle pain intermittent over last 2 weeks, treated with ABX for epididymitis but did not finish course. Pt alert and oriented X4, cooperative, RR even and unlabored, color WNL. Pt in NAD.

## 2019-07-08 NOTE — ED Provider Notes (Signed)
[facility] Emergency Department Provider Note  ____________________________________________  Time seen: Approximately 3:58 PM  I have reviewed the triage vital signs and the nursing notes.   HISTORY  Chief Complaint Testicle Pain    HPI Bruce Woodard is a 33 y.o. male presents to emergency department for evaluation of dull left testicular discomfort for 2 weeks.  Patient states that he was started on a course of doxycycline 2 weeks ago for epididymitis.  He only took about 3 days and felt better so he discontinued medication.  He called urgent care and explained his situation and a refill of his medication, which he started this morning.  Patient had epididymitis as a child.  He is not concerned for an STD.  He is currently trying to have a baby with his partner.  No trauma. No fever, pyuria, dysuria, hematuria.   Past Medical History:  Diagnosis Date  . Asthma    as a child    There are no active problems to display for this patient.   Past Surgical History:  Procedure Laterality Date  . REPAIR EXTENSOR TENDON Right 11/14/2015   Procedure: REPAIR EXTENSOR TENDON, 3rd and 5th;  Surgeon: Christena FlakeJohn J Poggi, MD;  Location: [facility] ORS;  Service: Orthopedics;  Laterality: Right;    Prior to Admission medications   Medication Sig Start Date End Date Taking? Authorizing Provider  albuterol (PROVENTIL HFA;VENTOLIN HFA) 108 (90 Base) MCG/ACT inhaler Inhale 2 puffs into the lungs every 6 (six) hours as needed. 07/28/18   Myrna BlazerSchaevitz, David Matthew, MD  brompheniramine-pseudoephedrine-DM 30-2-10 MG/5ML syrup Take 5 mLs by mouth 4 (four) times daily as needed. 12/25/18   Joni ReiningSmith, Ronald K, PA-C  buPROPion (WELLBUTRIN XL) 150 MG 24 hr tablet Take 150 mg by mouth daily. 05/21/19   [provider]  cetirizine (ZYRTEC) 10 MG tablet Take 10 mg by mouth daily.    [provider]  diphenhydrAMINE (BENADRYL) 50 MG capsule Take 50 mg by mouth every 6 (six) hours as  needed for itching.    [provider]  EPINEPHrine (EPIPEN 2-PAK) 0.3 mg/0.3 mL IJ SOAJ injection Inject 0.3 mLs (0.3 mg total) into the muscle as needed for up to 1 dose for anaphylaxis. 01/30/19   Dionne BucySiadecki, Sebastian, MD  ibuprofen (ADVIL) 800 MG tablet Take 1 tablet (800 mg total) by mouth every 8 (eight) hours as needed. 02/27/19   Emily FilbertWilliams, Jonathan E, MD  lidocaine (XYLOCAINE) 2 % solution Use as directed 15 mLs in the mouth or throat as needed for mouth pain. 02/27/19   Emily FilbertWilliams, Jonathan E, MD  moxifloxacin (VIGAMOX) 0.5 % ophthalmic solution Place 1 drop into the left eye 3 (three) times daily. 10/01/17   Payton Mccallumonty, Orlando, MD    Allergies Amoxicillin, Grass extracts [gramineae pollens], Peanut-containing drug products, Penicillins, Shrimp [shellfish allergy], and Tomato  Family History  Problem Relation Age of Onset  . Hypertension Mother   . Rheum arthritis Mother     Social History Social History   Tobacco Use  . Smoking status: Never Smoker  . Smokeless tobacco: Never Used  Substance Use Topics  . Alcohol use: Yes    Comment: socially  . Drug use: No     Review of Systems  Constitutional: No fever/chills Respiratory: No SOB. Gastrointestinal: No abdominal pain.  No nausea, no vomiting.  Musculoskeletal: Negative for musculoskeletal pain. Skin: Negative for rash, abrasions, lacerations, ecchymosis.   ____________________________________________   PHYSICAL EXAM:  VITAL SIGNS: ED Triage Vitals [07/08/19 1355]  Enc  Vitals Group     BP      Pulse Rate 69     Resp 16     Temp 98 F (36.7 C)     Temp Source Oral     SpO2 99 %     Weight 185 lb (83.9 kg)     Height 5\' 11"  (1.803 m)     Head Circumference      Peak Flow      Pain Score 7     Pain Loc      Pain Edu?      Excl. in [place]?      Constitutional: Alert and oriented. Well appearing and in no acute distress. Eyes: Conjunctivae are normal. PERRL. EOMI. Head: Atraumatic. ENT:      Ears:       Nose: No congestion/rhinnorhea.      Mouth/Throat: Mucous membranes are moist.  Neck: No stridor.  Cardiovascular: Normal rate, regular rhythm.  Good peripheral circulation. Respiratory: Normal respiratory effort without tachypnea or retractions. Lungs CTAB. Good air entry to the bases with no decreased or absent breath sounds. Gastrointestinal: Bowel sounds 4 quadrants. Soft and nontender to palpation. No guarding or rigidity. No palpable masses. No distention.  Genitourinary: RN present for testicular exam.  No tenderness with palpation to left testicle.  No overlying skin changes. Musculoskeletal: Full range of motion to all extremities. No gross deformities appreciated. Neurologic:  Normal speech and language. No gross focal neurologic deficits are appreciated.  Skin:  Skin is warm, dry and intact. No rash noted. Psychiatric: Mood and affect are normal. Speech and behavior are normal. Patient exhibits appropriate insight and judgement.   ____________________________________________   LABS (all labs ordered are listed, but only abnormal results are displayed)  Labs Reviewed  URINALYSIS, COMPLETE (UACMP) WITH MICROSCOPIC - Abnormal; Notable for the following components:      Result Value   Color, Urine COLORLESS (*)    APPearance CLEAR (*)    Specific Gravity, Urine 1.001 (*)    pH 9.0 (*)    All other components within normal limits  GC/CHLAMYDIA PROBE AMP  URINE CULTURE   ____________________________________________  EKG   ____________________________________________  RADIOLOGY Robinette Haines, personally viewed and evaluated these images (plain radiographs) as part of my medical decision making, as well as reviewing the written report by the radiologist.  US Scrotum W/doppler  Result Date: 07/08/2019 CLINICAL DATA:  Left testicular pain 2 weeks. EXAM: SCROTAL ULTRASOUND DOPPLER ULTRASOUND OF THE TESTICLES TECHNIQUE: Complete ultrasound examination of the testicles,  epididymis, and other scrotal structures was performed. Color and spectral Doppler ultrasound were also utilized to evaluate blood flow to the testicles. COMPARISON:  06/02/2012 FINDINGS: Right testicle Measurements: 4.1 x 1.8 x 2.5 cm. No mass or microlithiasis visualized. Left testicle Measurements: 4.0 x 1.8 x 3.1 cm. No mass or microlithiasis visualized. Right epididymis:  Normal in size and appearance. Left epididymis:  2.3 cm cyst. Hydrocele:  None visualized. Varicocele:  None visualized. Pulsed Doppler interrogation of both testes demonstrates normal low resistance arterial and venous waveforms bilaterally. IMPRESSION: Normal testicles without evidence of torsion.  No acute findings. 2.3 mm left epididymal cyst. Electronically Signed   By: Marin Olp M.D.   On: 07/08/2019 15:25    ____________________________________________    PROCEDURES  Procedure(s) performed:    Procedures    Medications - No data to display   ____________________________________________   INITIAL IMPRESSION / ASSESSMENT AND PLAN / ED COURSE  Pertinent labs & imaging  results that were available during my care of the patient were reviewed by me and considered in my medical decision making (see chart for details).  Review of the [place] CSRS was performed in accordance of the [facility] prior to dispensing any controlled drugs.     Patient presents to the emergency department for intermittent testicular discomfort.  Vital signs and exam are reassuring.  Scrotal ultrasound consistent with left epididymal cyst.  Patient denies any concern for STD.  STD test is pending.  No indication of infection on urinalysis.  Urine was sent for culture.  Patient will finish his prescription for doxycycline.  Patient is to follow up with urology as directed.  Referral was given.  Patient is given ED precautions to return to the ED for any worsening or new symptoms.   PAYNE TESKEY was evaluated in Emergency Department on  07/08/2019 for the symptoms described in the history of present illness. He was evaluated in the context of the global COVID-19 pandemic, which necessitated consideration that the patient might be at risk for infection with the SARS-CoV-2 virus that causes COVID-19. Institutional protocols and algorithms that pertain to the evaluation of patients at risk for COVID-19 are in a state of rapid change based on information released by regulatory bodies including the CDC and federal and state organizations. These policies and algorithms were followed during the patient's care in the ED.  ____________________________________________  FINAL CLINICAL IMPRESSION(S) / ED DIAGNOSES  Final diagnoses:  Epididymal cyst      NEW MEDICATIONS STARTED DURING THIS VISIT:  ED Discharge Orders    None          This chart was dictated using voice recognition software/Dragon. Despite best efforts to proofread, errors can occur which can change the meaning. Any change was purely unintentional.    Enid Derry, PA-C 07/08/19 1741    Willy Eddy, MD 07/09/19 [id number]

## 2019-07-08 NOTE — Discharge Instructions (Signed)
You have a cyst in your testicle.  This is likely causing your discomfort.  Please pick up and finish your prescription for your doxycycline.  The cyst will not go away with the antibiotic but any infection well.  Please call urology for an appointment for follow-up.

## 2019-07-10 LAB — URINE CULTURE: Culture: NO GROWTH

## 2019-07-12 LAB — GC/CHLAMYDIA PROBE AMP
Chlamydia trachomatis, NAA: NEGATIVE
Neisseria Gonorrhoeae by PCR: NEGATIVE

## 2019-07-21 ENCOUNTER — Ambulatory Visit: Payer: Self-pay | Admitting: Urology

## 2019-08-19 ENCOUNTER — Ambulatory Visit: Payer: BC Managed Care – PPO | Admitting: Urology

## 2019-08-31 ENCOUNTER — Encounter: Payer: Self-pay | Admitting: Emergency Medicine

## 2019-08-31 ENCOUNTER — Other Ambulatory Visit: Payer: Self-pay

## 2019-08-31 ENCOUNTER — Emergency Department
Admission: EM | Admit: 2019-08-31 | Discharge: 2019-08-31 | Disposition: A | Discharge status: Home / Self Care | Payer: BC Managed Care – PPO | Attending: Emergency Medicine | Admitting: Emergency Medicine

## 2019-08-31 DIAGNOSIS — R05 Cough: Secondary | ICD-10-CM | POA: Diagnosis present

## 2019-08-31 DIAGNOSIS — Z5321 Procedure and treatment not carried out due to patient leaving prior to being seen by health care provider: Secondary | ICD-10-CM | POA: Insufficient documentation

## 2019-08-31 NOTE — ED Triage Notes (Signed)
Pt reports cough since last Thursday. Pt reports went to [facility] and they did a COVID test and sent him here.

## 2019-08-31 NOTE — ED Notes (Signed)
Called for room assignment x2. No answer.

## 2019-08-31 NOTE — ED Notes (Signed)
Called for room assignment x3. No answer.

## 2019-08-31 NOTE — ED Notes (Signed)
Called for room assignment x1. No answer. 

## 2019-09-04 ENCOUNTER — Other Ambulatory Visit: Payer: Self-pay

## 2019-09-04 ENCOUNTER — Encounter: Payer: Self-pay | Admitting: Emergency Medicine

## 2019-09-04 ENCOUNTER — Emergency Department
Admission: EM | Admit: 2019-09-04 | Discharge: 2019-09-04 | Disposition: A | Discharge status: Home / Self Care | Payer: BC Managed Care – PPO | Attending: Emergency Medicine | Admitting: Emergency Medicine

## 2019-09-04 ENCOUNTER — Emergency Department: Payer: BC Managed Care – PPO

## 2019-09-04 DIAGNOSIS — R059 Cough, unspecified: Secondary | ICD-10-CM

## 2019-09-04 DIAGNOSIS — Z79899 Other long term (current) drug therapy: Secondary | ICD-10-CM | POA: Insufficient documentation

## 2019-09-04 DIAGNOSIS — R05 Cough: Secondary | ICD-10-CM | POA: Insufficient documentation

## 2019-09-04 DIAGNOSIS — Z91010 Allergy to peanuts: Secondary | ICD-10-CM | POA: Insufficient documentation

## 2019-09-04 MED ORDER — PREDNISONE 50 MG PO TABS: ORAL_TABLET | ORAL | 0 refills | Status: DC

## 2019-09-04 NOTE — Discharge Instructions (Signed)
Take 1 tablet of prednisone daily for the next 5 days.

## 2019-09-04 NOTE — ED Triage Notes (Signed)
Cough x 1 week, COVID test on 11/5 or 6 negative. Cough continues.

## 2019-09-04 NOTE — ED Provider Notes (Signed)
[facility] Emergency Department Provider Note  ____________________________________________  Time seen: Approximately 5:55 PM  I have reviewed the triage vital signs and the nursing notes.   HISTORY  Chief Complaint Cough    HPI Bruce Woodard is a 33 y.o. male presents to the emergency department with persistent cough for the past 4 to 5 days.  Patient denies shortness of breath or sputum production.  No associated rhinorrhea, nasal congestion, headache, fever or diarrhea.  Patient denies a history of pneumonia.  Patient states that he was doing jumping jacks last night and has been his normal active self.  He states that cough is keeping him awake at night and that was his reason for coming in today.  No other alleviating measures have been attempted.        Past Medical History:  Diagnosis Date  . Asthma    as a child    There are no active problems to display for this patient.   Past Surgical History:  Procedure Laterality Date  . REPAIR EXTENSOR TENDON Right 11/14/2015   Procedure: REPAIR EXTENSOR TENDON, 3rd and 5th;  Surgeon: Christena FlakeJohn J Poggi, MD;  Location: [facility] ORS;  Service: Orthopedics;  Laterality: Right;    Prior to Admission medications   Medication Sig Start Date End Date Taking? Authorizing Provider  albuterol (PROVENTIL HFA;VENTOLIN HFA) 108 (90 Base) MCG/ACT inhaler Inhale 2 puffs into the lungs every 6 (six) hours as needed. 07/28/18   Myrna BlazerSchaevitz, David Matthew, MD  buPROPion (WELLBUTRIN XL) 150 MG 24 hr tablet Take 150 mg by mouth daily. 05/21/19   [provider]  cetirizine (ZYRTEC) 10 MG tablet Take 10 mg by mouth daily.    [provider]  diphenhydrAMINE (BENADRYL) 50 MG capsule Take 50 mg by mouth every 6 (six) hours as needed for itching.    [provider]  EPINEPHrine (EPIPEN 2-PAK) 0.3 mg/0.3 mL IJ SOAJ injection Inject 0.3 mLs (0.3 mg total) into the muscle as needed for up to 1 dose for anaphylaxis.  01/30/19   Dionne BucySiadecki, Sebastian, MD  fluticasone (FLONASE) 50 MCG/ACT nasal spray Place 2 sprays into both nostrils daily.    [provider]  lidocaine (XYLOCAINE) 2 % solution Use as directed 15 mLs in the mouth or throat as needed for mouth pain. 02/27/19   Emily FilbertWilliams, Jonathan E, MD  predniSONE (DELTASONE) 50 MG tablet Take one tablet daily for the next five days. 09/04/19   Orvil FeilWoods, Toccara Alford M, PA-C    Allergies Amoxicillin, Grass extracts [gramineae pollens], Peanut-containing drug products, Penicillins, Shrimp [shellfish allergy], and Tomato  Family History  Problem Relation Age of Onset  . Hypertension Mother   . Rheum arthritis Mother     Social History Social History   Tobacco Use  . Smoking status: Never Smoker  . Smokeless tobacco: Never Used  Substance Use Topics  . Alcohol use: Yes    Comment: socially  . Drug use: No     Review of Systems  Constitutional: No fever/chills Eyes: No visual changes. No discharge ENT: No upper respiratory complaints. Cardiovascular: no chest pain. Respiratory: Patient has cough. No SOB. Gastrointestinal: No abdominal pain.  No nausea, no vomiting.  No diarrhea.  No constipation. Genitourinary: Negative for dysuria. No hematuria Musculoskeletal: Negative for musculoskeletal pain. Skin: Negative for rash, abrasions, lacerations, ecchymosis. Neurological: Negative for headaches, focal weakness or numbness.   ____________________________________________   PHYSICAL EXAM:  VITAL SIGNS: ED Triage Vitals  Enc Vitals Group  BP 09/04/19 1624 127/79     Pulse Rate 09/04/19 1624 64     Resp 09/04/19 1624 18     Temp 09/04/19 1624 97.7 F (36.5 C)     Temp Source 09/04/19 1624 Oral     SpO2 09/04/19 1624 99 %     Weight 09/04/19 1625 185 lb (83.9 kg)     Height 09/04/19 1625 5\' 11"  (1.803 m)     Head Circumference --      Peak Flow --      Pain Score 09/04/19 1625 0     Pain Loc --      Pain Edu? --      Excl. in [place]? --       Constitutional: Alert and oriented. Well appearing and in no acute distress. Eyes: Conjunctivae are normal. PERRL. EOMI. Head: Atraumatic. Cardiovascular: Normal rate, regular rhythm. Normal S1 and S2.  Good peripheral circulation. Respiratory: Normal respiratory effort without tachypnea or retractions. Lungs CTAB. Good air entry to the bases with no decreased or absent breath sounds. Gastrointestinal: Bowel sounds 4 quadrants. Soft and nontender to palpation. No guarding or rigidity. No palpable masses. No distention. No CVA tenderness. Musculoskeletal: Full range of motion to all extremities. No gross deformities appreciated. Neurologic:  Normal speech and language. No gross focal neurologic deficits are appreciated.  Skin:  Skin is warm, dry and intact. No rash noted. Psychiatric: Mood and affect are normal. Speech and behavior are normal. Patient exhibits appropriate insight and judgement.   ____________________________________________   LABS (all labs ordered are listed, but only abnormal results are displayed)  Labs Reviewed - No data to display ____________________________________________  EKG   ____________________________________________  RADIOLOGY I personally viewed and evaluated these images as part of my medical decision making, as well as reviewing the written report by the radiologist.  Dg Chest 1 View  Result Date: 09/04/2019 CLINICAL DATA:  Cough EXAM: CHEST  1 VIEW COMPARISON:  07/27/2018 FINDINGS: The heart size and mediastinal contours are within normal limits. Both lungs are clear. The visualized skeletal structures are unremarkable. IMPRESSION: No acute abnormality of the lungs in AP portable projection. Electronically Signed   By: 09/26/2018 M.D.   On: 09/04/2019 16:56    ____________________________________________    PROCEDURES  Procedure(s) performed:    Procedures    Medications - No data to  display   ____________________________________________   INITIAL IMPRESSION / ASSESSMENT AND PLAN / ED COURSE  Pertinent labs & imaging results that were available during my care of the patient were reviewed by me and considered in my medical decision making (see chart for details).  Review of the [place] CSRS was performed in accordance of the [facility] prior to dispensing any controlled drugs.         Assessment and plan Bronchitis 33 year old male presents to the emergency department with nonproductive cough for the past 4 to 5 days.  Chest x-ray reveals no consolidations, opacities or infiltrates.  Vital signs remain reassuring throughout emergency department course.  Patient was discharged with a short course of prednisone.  He was advised to follow-up with primary care as needed.  ____________________________________________  FINAL CLINICAL IMPRESSION(S) / ED DIAGNOSES  Final diagnoses:  Cough      NEW MEDICATIONS STARTED DURING THIS VISIT:  ED Discharge Orders         Ordered    predniSONE (DELTASONE) 50 MG tablet     09/04/19 1727  This chart was dictated using voice recognition software/Dragon. Despite best efforts to proofread, errors can occur which can change the meaning. Any change was purely unintentional.    Lannie Fields, PA-C 09/04/19 1757    Nena Polio, MD 09/04/19 [phone]

## 2019-09-04 NOTE — ED Notes (Signed)
Pt is currently on prednisone and inhaler rx from "demand dr?" for cough and cold symptoms. States he wakes up and feels like congested and settled in (points to his upper rt quadrant). Dry cough and unable to cough anything up with mucinex or dayquil. Alert, oriented and in nad.

## 2019-09-07 ENCOUNTER — Ambulatory Visit: Payer: BC Managed Care – PPO | Admitting: Urology

## 2019-11-14 ENCOUNTER — Other Ambulatory Visit: Payer: Self-pay

## 2019-11-14 ENCOUNTER — Emergency Department
Admission: EM | Admit: 2019-11-14 | Discharge: 2019-11-15 | Disposition: A | Discharge status: Home / Self Care | Payer: BC Managed Care – PPO | Attending: Emergency Medicine | Admitting: Emergency Medicine

## 2019-11-14 DIAGNOSIS — J45909 Unspecified asthma, uncomplicated: Secondary | ICD-10-CM | POA: Diagnosis not present

## 2019-11-14 DIAGNOSIS — Z91010 Allergy to peanuts: Secondary | ICD-10-CM | POA: Diagnosis not present

## 2019-11-14 DIAGNOSIS — Z79899 Other long term (current) drug therapy: Secondary | ICD-10-CM | POA: Diagnosis not present

## 2019-11-14 DIAGNOSIS — R600 Localized edema: Secondary | ICD-10-CM | POA: Diagnosis present

## 2019-11-14 DIAGNOSIS — T7840XA Allergy, unspecified, initial encounter: Secondary | ICD-10-CM | POA: Diagnosis not present

## 2019-11-14 MED ORDER — DIPHENHYDRAMINE HCL 50 MG/ML IJ SOLN: 25.0000 mg | Freq: Once | INTRAMUSCULAR | Status: AC

## 2019-11-14 MED ORDER — METHYLPREDNISOLONE SODIUM SUCC 125 MG IJ SOLR: 125.0000 mg | Freq: Once | INTRAMUSCULAR | Status: AC

## 2019-11-14 MED ORDER — FAMOTIDINE IN NACL 20-0.9 MG/50ML-% IV SOLN
20.0000 mg | Freq: Once | INTRAVENOUS | Status: AC
  Administered 2019-11-14: 20 mg via INTRAVENOUS

## 2019-11-14 MED ORDER — METHYLPREDNISOLONE SODIUM SUCC 125 MG IJ SOLR
INTRAMUSCULAR | Status: AC
  Administered 2019-11-14: 125 mg via INTRAVENOUS
  Filled 2019-11-14: qty 2

## 2019-11-14 MED ORDER — DIPHENHYDRAMINE HCL 50 MG/ML IJ SOLN
INTRAMUSCULAR | Status: AC
  Administered 2019-11-14: 25 mg via INTRAVENOUS
  Filled 2019-11-14: qty 1

## 2019-11-14 NOTE — ED Triage Notes (Signed)
Patient presents with tongue swelling after having come in contact with an allergen at his in-laws house. Thinks he may have come in contact with tomatoes. Took two Benadryl liquagels prior to arrival.

## 2019-11-14 NOTE — ED Notes (Addendum)
Patient has tongue swelling and the feeling that his uvula "is touching his tongue."

## 2019-11-14 NOTE — ED Provider Notes (Signed)
[facility] Emergency Department Provider Note  ____________________________________________   First MD Initiated Contact with Patient 11/14/19 2328     (approximate)  I have reviewed the triage vital signs and the nursing notes.   HISTORY  Chief Complaint No chief complaint on file.    HPI Bruce Woodard is a 34 y.o. male with history of previous reactions including previous allergic reactions presents to the emergency department secondary to acute onset of tongue swelling and tickle in his throat which he believes is secondary to coming in contact with an allergen at his in-laws house tonight at 10:40 PM.  Patient states that he took 2 Benadryl liquid gel tablets prior to arrival.  Patient does have an EpiPen but did not administer it.  Patient denies any rash but does admit to pruritus.        Past Medical History:  Diagnosis Date  . Asthma    as a child    There are no problems to display for this patient.   Past Surgical History:  Procedure Laterality Date  . REPAIR EXTENSOR TENDON Right 11/14/2015   Procedure: REPAIR EXTENSOR TENDON, 3rd and 5th;  Surgeon: Christena Flake, MD;  Location: [facility] ORS;  Service: Orthopedics;  Laterality: Right;    Prior to Admission medications   Medication Sig Start Date End Date Taking? Authorizing Provider  albuterol (PROVENTIL HFA;VENTOLIN HFA) 108 (90 Base) MCG/ACT inhaler Inhale 2 puffs into the lungs every 6 (six) hours as needed. 07/28/18   Myrna Blazer, MD  buPROPion (WELLBUTRIN XL) 150 MG 24 hr tablet Take 150 mg by mouth daily. 05/21/19   [provider]  cetirizine (ZYRTEC) 10 MG tablet Take 10 mg by mouth daily.    [provider]  diphenhydrAMINE (BENADRYL) 50 MG capsule Take 50 mg by mouth every 6 (six) hours as needed for itching.    [provider]  EPINEPHrine (EPIPEN 2-PAK) 0.3 mg/0.3 mL IJ SOAJ injection Inject 0.3 mLs (0.3 mg total) into the muscle as needed  for up to 1 dose for anaphylaxis. 01/30/19   Dionne Bucy, MD  fluticasone (FLONASE) 50 MCG/ACT nasal spray Place 2 sprays into both nostrils daily.    [provider]  lidocaine (XYLOCAINE) 2 % solution Use as directed 15 mLs in the mouth or throat as needed for mouth pain. 02/27/19   Emily Filbert, MD  predniSONE (DELTASONE) 50 MG tablet Take one tablet daily for the next five days. 09/04/19   Orvil Feil, PA-C    Allergies Amoxicillin, Grass extracts [gramineae pollens], Peanut-containing drug products, Penicillins, Shrimp [shellfish allergy], and Tomato  Family History  Problem Relation Age of Onset  . Hypertension Mother   . Rheum arthritis Mother     Social History Social History   Tobacco Use  . Smoking status: Never Smoker  . Smokeless tobacco: Never Used  Substance Use Topics  . Alcohol use: Yes    Comment: socially  . Drug use: No    Review of Systems Constitutional: No fever/chills Eyes: No visual changes. ENT: No sore throat.  Positive for tongue swelling Cardiovascular: Denies chest pain. Respiratory: Denies shortness of breath. Gastrointestinal: No abdominal pain.  No nausea, no vomiting.  No diarrhea.  No constipation. Genitourinary: Negative for dysuria. Musculoskeletal: Negative for neck pain.  Negative for back pain. Integumentary: Negative for rash. Neurological: Negative for headaches, focal weakness or numbness.  ____________________________________________   PHYSICAL EXAM:  VITAL SIGNS: ED Triage Vitals  Enc Vitals  Group     BP 11/14/19 2315 (!) 139/94     Pulse Rate 11/14/19 2315 67     Resp 11/14/19 2315 18     Temp 11/14/19 2315 97.8 F (36.6 C)     Temp Source 11/14/19 2315 Oral     SpO2 11/14/19 2315 100 %     Weight 11/14/19 2316 83.9 kg (185 lb)     Height 11/14/19 2316 1.803 m (5\' 11" )     Head Circumference --      Peak Flow --      Pain Score 11/14/19 2316 0     Pain Loc --      Pain Edu? --       Excl. in [place]? --     Constitutional: Alert and oriented.  Eyes: Conjunctivae are normal.  Mouth/Throat: Mallampati 1, no uvular hydrops Neck: No stridor.  No meningeal signs.   Cardiovascular: Normal rate, regular rhythm. Good peripheral circulation. Grossly normal heart sounds. Respiratory: Normal respiratory effort.  No retractions. Gastrointestinal: Soft and nontender. No distention.  Musculoskeletal: No lower extremity tenderness nor edema. No gross deformities of extremities. Neurologic:  Normal speech and language. No gross focal neurologic deficits are appreciated.  Skin:  Skin is warm, dry and intact. Psychiatric: Mood and affect are normal. Speech and behavior are normal.     Procedures   ____________________________________________   INITIAL IMPRESSION / MDM / ASSESSMENT AND PLAN / ED COURSE  As part of my medical decision making, I reviewed the following data within the electronic MEDICAL RECORD NUMBER   34 year old male presented with above-stated history and physical exam consistent with an acute allergic reaction.  Patient given IV Solu-Medrol 125 mg, Benadryl 25 mg IV and Pepcid 20 mg IV.  On reevaluation patient states that he feels better".  No tongue swelling appreciated at this time.  Patient states that he has 2 EpiPen's at home.  Patient will be prescribed prednisone for home      ____________________________________________  FINAL CLINICAL IMPRESSION(S) / ED DIAGNOSES  Final diagnoses:  Allergic reaction, initial encounter     MEDICATIONS GIVEN DURING THIS VISIT:  Medications  diphenhydrAMINE (BENADRYL) 50 MG/ML injection (has no administration in time range)  methylPREDNISolone sodium succinate (SOLU-MEDROL) 125 mg/2 mL injection (has no administration in time range)  methylPREDNISolone sodium succinate (SOLU-MEDROL) 125 mg/2 mL injection 125 mg (has no administration in time range)  diphenhydrAMINE (BENADRYL) injection 25 mg (has no administration in  time range)  famotidine (PEPCID) IVPB 20 mg premix (has no administration in time range)     ED Discharge Orders    None      *Please note:  Bruce Woodard was evaluated in Emergency Department on 11/14/2019 for the symptoms described in the history of present illness. He was evaluated in the context of the global COVID-19 pandemic, which necessitated consideration that the patient might be at risk for infection with the SARS-CoV-2 virus that causes COVID-19. Institutional protocols and algorithms that pertain to the evaluation of patients at risk for COVID-19 are in a state of rapid change based on information released by regulatory bodies including the CDC and federal and state organizations. These policies and algorithms were followed during the patient's care in the ED.  Some ED evaluations and interventions may be delayed as a result of limited staffing during the pandemic.*  Note:  This document was prepared using Dragon voice recognition software and may include unintentional dictation errors.   Gregor Hams, MD 11/15/19  0227  

## 2019-11-15 MED ORDER — PREDNISONE 20 MG PO TABS: 60.0000 mg | ORAL_TABLET | Freq: Every day | ORAL | 0 refills | Status: AC

## 2019-11-30 ENCOUNTER — Ambulatory Visit
Admission: EM | Admit: 2019-11-30 | Discharge: 2019-11-30 | Disposition: A | Discharge status: Home / Self Care | Payer: BC Managed Care – PPO | Attending: Family Medicine | Admitting: Family Medicine

## 2019-11-30 ENCOUNTER — Other Ambulatory Visit: Payer: Self-pay

## 2019-12-21 ENCOUNTER — Encounter: Payer: Self-pay | Admitting: Urology

## 2019-12-21 ENCOUNTER — Other Ambulatory Visit: Payer: Self-pay

## 2019-12-21 ENCOUNTER — Ambulatory Visit (INDEPENDENT_AMBULATORY_CARE_PROVIDER_SITE_OTHER): Payer: BC Managed Care – PPO | Admitting: Urology

## 2019-12-21 ENCOUNTER — Other Ambulatory Visit: Payer: BC Managed Care – PPO

## 2019-12-21 VITALS — BP 124/72 | HR 60 | Ht 71.0 in | Wt 185.0 lb

## 2019-12-21 DIAGNOSIS — N503 Cyst of epididymis: Secondary | ICD-10-CM | POA: Diagnosis not present

## 2019-12-21 DIAGNOSIS — Z91018 Allergy to other foods: Secondary | ICD-10-CM | POA: Insufficient documentation

## 2019-12-21 DIAGNOSIS — Z3141 Encounter for fertility testing: Secondary | ICD-10-CM | POA: Diagnosis not present
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
## 2019-12-21 NOTE — Patient Instructions (Signed)
Male Infertility  Male infertility refers to a man's inability to get a woman pregnant (get her to conceive) after a year of having sex regularly without using birth control. Both men and women can have fertility problems. What are the causes? This condition may be caused by:  Problems with sperm. Infertility can result if a man is: ? Not producing enough sperm (low sperm count). ? Not producing enough sperm of normal size and shape (poor sperm morphology). ? Producing sperm that are not able to reach the egg (poor motility).  Problems in a man's reproductive organs, such as: ? Enlarged veins (varicoceles), cysts (spermatoceles), or tumors of the testicles. ? Sexual dysfunction, including inability to have an erection. ? Injury to the testicles. ? Having had a testicle that did not drop to its location in the scrotum (undescended testicle). ? A birth defect, such as not having the tubes that carry sperm (vas deferens). ? Lack of certain hormones.  Certain medical conditions. These may include: ? Diabetes. ? Cancer and cancer treatments, such as chemotherapy or radiation. ? Klinefelter syndrome. This is an inherited genetic disorder. ? Thyroid problems, such as an underactive or overactive thyroid. ? Cystic fibrosis. ? Infections. ? Sexually transmitted diseases. Infertility can be linked to more than one cause. The cause of infertility in some men is not known (unexplained infertility). What increases the risk?  Age. A man's fertility declines with age.  Smoking tobacco.  Excessive alcohol use.  Obesity.  Emotional stress.  Exposure of heat to the testicles, such as frequent use of a hot tub or sauna.  Using drugs such as anabolic steroids, cocaine, and marijuana.  Being exposed to environmental toxins, such as pesticides and lead. What are the signs or symptoms? The main sign of infertility in men is the inability to get a woman to conceive. How is this  diagnosed? This condition may be diagnosed using:  Semen tests to check sperm count, morphology, and motility.  Blood tests to check hormone levels.  Ultrasound of the scrotum to check for a varicocele or problems with the testicles.  Transrectal ultrasound to check the prostate gland and to look for problems with the tubes that transport the semen (seminal vesicles).  Taking a small sample of tissue from inside a testicle (biopsy). This is examined under a microscope.  Blood tests to check for genetic abnormalities (genetic testing). To be diagnosed with infertility, both partners will have a physical exam. Both partners will also have an extensive medical and sexual history taken. Additional tests may be done. How is this treated? Treatment depends on the cause of infertility. Most cases of infertility in men are treated with medicine, surgery, or lifestyle changes.  Men may take medicine to: ? Correct hormone problems. ? Treat other health conditions. ? Treat infections. ? Treat sexual dysfunction.  Surgery may be done to: ? Remove blockages in the reproductive tract. ? Correct other structural problems of the reproductive tract.  Lifestyle changes, such as: ? Reducing or not drinking alcohol. ? Stopping use of drugs such as anabolic steroids, cocaine, and marijuana. ? Losing weight. ? Stopping smoking. ? Using stress reduction techniques. Assisted reproductive technology (ART) Assisted reproductive technology (ART) refers to all treatments and procedures that combine eggs and sperm outside the body to try to help a couple conceive. Sperm is collected through normal ejaculation or surgery, or from a donor. ART is often combined with fertility drugs to stimulate ovulation in the woman. Sometimes ART is done using   eggs retrieved from another woman's body (donor eggs) or from previously frozen fertilized eggs (embryos). There are different types of ART. These  include:  Intrauterine insemination (IUI). A long, thin tube is used to place sperm directly into a woman's uterus. This procedure: ? Is effective for infertility caused by sperm problems, including low sperm count and low motility. ? Can be used in combination with fertility drugs.  In vitro fertilization (IVF). This is done when a woman's fallopian tubes are blocked or when a man has low sperm count. In this procedure: ? Fertility drugs are used to stimulate the ovaries to produce multiple eggs. ? Once mature, these eggs are removed from the body and combined with the sperm to be fertilized. ? The fertilized eggs are then placed into the woman's uterus. Follow these instructions at home:  Take over-the-counter and prescription medicines only as told by your health care provider.  Do not use any products that contain nicotine or tobacco, such as cigarettes and e-cigarettes. If you need help quitting, ask your health care provider.  If you drink alcohol, limit how much you have to 0-2 drinks a day.  Make dietary changes to lose weight or maintain a healthy weight. Work with your health care provider and a dietitian to set a weight-loss goal that is healthy and reasonable for you.  Seek support from a counselor or support group to talk about your concerns related to infertility. Couples counseling may be helpful for you and your partner.  Practice stress reduction techniques that work well for you, such as regular physical activity, meditation, or deep breathing.  Keep all follow-up visits as told by your health care provider. This is important. Contact a health care provider if you:  Feel that stress is interfering with your life and relationships.  Have side effects from treatments for infertility. Summary  Male infertility refers to a man's inability to get a woman pregnant (get her to conceive) after a year of having sex regularly without using birth control.  To be diagnosed  with infertility, both partners will have a physical exam. Both partners will also have an extensive medical and sexual history taken.  Seek support from a counselor or support group to talk about your concerns related to infertility. Couples counseling may be helpful for you and your partner. This information is not intended to replace advice given to you by your health care provider. Make sure you discuss any questions you have with your health care provider. Document Revised: 09/08/2017 Document Reviewed: 09/08/2017 Elsevier Patient Education  2020 [employer]  

## 2020-03-04 ENCOUNTER — Encounter: Payer: Self-pay | Admitting: Emergency Medicine

## 2020-03-04 ENCOUNTER — Emergency Department
Admission: EM | Admit: 2020-03-04 | Discharge: 2020-03-04 | Disposition: A | Discharge status: Home / Self Care | Payer: Self-pay | Attending: Emergency Medicine | Admitting: Emergency Medicine

## 2020-03-04 ENCOUNTER — Other Ambulatory Visit: Payer: Self-pay

## 2020-03-04 DIAGNOSIS — Z79899 Other long term (current) drug therapy: Secondary | ICD-10-CM | POA: Insufficient documentation

## 2020-03-04 DIAGNOSIS — T7840XA Allergy, unspecified, initial encounter: Secondary | ICD-10-CM | POA: Insufficient documentation

## 2020-03-04 MED ORDER — DIPHENHYDRAMINE HCL 50 MG/ML IJ SOLN
25.0000 mg | Freq: Once | INTRAMUSCULAR | Status: AC
  Administered 2020-03-04: 25 mg via INTRAVENOUS
  Filled 2020-03-04: qty 1

## 2020-03-04 MED ORDER — DEXAMETHASONE SODIUM PHOSPHATE 10 MG/ML IJ SOLN
10.0000 mg | Freq: Once | INTRAMUSCULAR | Status: AC
  Administered 2020-03-04: 10 mg via INTRAVENOUS
  Filled 2020-03-04: qty 1

## 2020-03-04 MED ORDER — FAMOTIDINE IN NACL 20-0.9 MG/50ML-% IV SOLN
20.0000 mg | Freq: Once | INTRAVENOUS | Status: AC
  Administered 2020-03-04: 20 mg via INTRAVENOUS
  Filled 2020-03-04: qty 50

## 2020-03-04 MED ORDER — PREDNISONE 10 MG (21) PO TBPK: ORAL_TABLET | ORAL | 0 refills | Status: DC

## 2020-03-04 MED ORDER — SODIUM CHLORIDE 0.9 % IV BOLUS
1000.0000 mL | Freq: Once | INTRAVENOUS | Status: AC
  Administered 2020-03-04: 1000 mL via INTRAVENOUS

## 2020-03-04 NOTE — ED Provider Notes (Addendum)
[facility] Emergency Department Provider Note  ____________________________________________   First MD Initiated Contact with Patient 03/04/20 1419     (approximate)  I have reviewed the triage vital signs and the nursing notes.   HISTORY  Chief Complaint Sore Throat    HPI Bruce Woodard is a 34 y.o. male presents to the emergency department stating he thinks he has an allergic reaction to tomatoes.  He had spaghetti last night although he did not eat the tomato sauce he thinks something came to contact with it.  He woke up with sore itchy throat and itching.  He took Benadryl and 40 mg of prednisone prior to arrival.  States he felt a little bit better but is still feeling very itchy.  No difficulty breathing.  No nausea or vomiting.  No diarrhea.    Past Medical History:  Diagnosis Date  . Asthma    as a child    Patient Active Problem List   Diagnosis Date Noted  . 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 Laceration of extensor tendon of hand 11/23/2015    Past Surgical History:  Procedure Laterality Date  . REPAIR EXTENSOR TENDON Right 11/14/2015   Procedure: REPAIR EXTENSOR TENDON, 3rd and 5th;  Surgeon: Christena Flake, MD;  Location: [facility] ORS;  Service: Orthopedics;  Laterality: Right;    Prior to Admission medications   Medication Sig Start Date End Date Taking? Authorizing Provider  albuterol (PROVENTIL HFA;VENTOLIN HFA) 108 (90 Base) MCG/ACT inhaler Inhale 2 puffs into the lungs every 6 (six) hours as needed. 07/28/18   Myrna Blazer, MD  buPROPion (WELLBUTRIN XL) 150 MG 24 hr tablet Take 150 mg by mouth daily. 05/21/19   [provider]  cetirizine (ZYRTEC) 10 MG tablet Take 10 mg by mouth daily.    [provider]  Diphenhyd-Hydrocort-Nystatin (FIRST-DUKES MOUTHWASH MT) Swish and spit 5 mLs every 6 (six) hours as needed for Pain Duke's Magic Mouthwash (contains the following ratios): Nystatin susp 100,000 units/mL = 30  mL Hydrocortisone = 60 mg. Viscus Xylocaine= 30 ml QS with diphenhydramine HCl syrup to volume of 240 mL 12/13/19   [provider]  diphenhydrAMINE (BENADRYL) 50 MG capsule Take 50 mg by mouth every 6 (six) hours as needed for itching.    [provider]  EPINEPHrine (EPIPEN 2-PAK) 0.3 mg/0.3 mL IJ SOAJ injection Inject 0.3 mLs (0.3 mg total) into the muscle as needed for up to 1 dose for anaphylaxis. 01/30/19   Dionne Bucy, MD  fluticasone (FLONASE) 50 MCG/ACT nasal spray Place 2 sprays into both nostrils daily.    [provider]  predniSONE (STERAPRED UNI-PAK 21 TAB) 10 MG (21) TBPK tablet Take 6 pills on day one then decrease by 1 pill each day 03/04/20   Faythe Ghee, PA-C    Allergies Shellfish allergy, Amoxicillin, Grass extracts [gramineae pollens], Peanut-containing drug products, Penicillins, and Tomato  Family History  Problem Relation Age of Onset  . Hypertension Mother   . Rheum arthritis Mother     Social History Social History   Tobacco Use  . Smoking status: Never Smoker  . Smokeless tobacco: Never Used  Substance Use Topics  . Alcohol use: Yes    Comment: socially  . Drug use: No    Review of Systems  Constitutional: No fever/chills Eyes: No visual changes. ENT: No sore throat. Respiratory: Denies cough Cardiovascular: Denies chest pain Gastrointestinal: Denies abdominal pain Genitourinary: Negative for dysuria. Musculoskeletal: Negative for back pain. Skin:  Negative for rash. Psychiatric: no mood changes,     ____________________________________________   PHYSICAL EXAM:  VITAL SIGNS: ED Triage Vitals  Enc Vitals Group     BP 03/04/20 1340 (!) 135/97     Pulse Rate 03/04/20 1340 75     Resp 03/04/20 1340 16     Temp 03/04/20 1340 97.7 F (36.5 C)     Temp Source 03/04/20 1340 Oral     SpO2 03/04/20 1340 100 %     Weight 03/04/20 1346 185 lb (83.9 kg)     Height 03/04/20 1346 5\' 11"  (1.803 m)     Head  Circumference --      Peak Flow --      Pain Score 03/04/20 1346 0     Pain Loc --      Pain Edu? --      Excl. in GC? --     Constitutional: Alert and oriented. Well appearing and in no acute distress. Eyes: Conjunctivae are normal.  Head: Atraumatic. Nose: No congestion/rhinnorhea. Mouth/Throat: Mucous membranes are moist.  Throat appears normal with normal airway Neck:  supple no lymphadenopathy noted Cardiovascular: Normal rate, regular rhythm. Heart sounds are normal Respiratory: Normal respiratory effort.  No retractions, lungs c t a  GU: deferred Musculoskeletal: FROM all extremities, warm and well perfused Neurologic:  Normal speech and language.  Skin:  Skin is warm, dry and intact. No rash noted.  No hives noted Psychiatric: Mood and affect are normal. Speech and behavior are normal.  ____________________________________________   LABS (all labs ordered are listed, but only abnormal results are displayed)  Labs Reviewed - No data to display ____________________________________________   ____________________________________________  RADIOLOGY    ____________________________________________   PROCEDURES  Procedure(s) performed: Saline lock, 1 L normal saline, Decadron 10 mg IV, Benadryl 25 mg IV, Pepcid IV   Procedures    ____________________________________________   INITIAL IMPRESSION / ASSESSMENT AND PLAN / ED COURSE  Pertinent labs & imaging results that were available during my care of the patient were reviewed by me and considered in my medical decision making (see chart for details).   Patient is a 34 year old male presents emergency department with concerns of an allergic reaction.  See HPI  Physical exam shows patient to appear well.  No hives are noted.  Patient is scratching at his eyes face and cheeks.  Some scratching on the chest but no hives are noted.  Airway is patent.  No wheezing noted.  DDx: Allergic reaction, anxiety, seasonal  allergies, pharyngitis  Discussed the findings with the patient.  He was given normal saline 1 L IV, Decadron 10 mg IV, Benadryl 25 mg IV, and Pepcid IV.  He is feeling better after the medications.  He will be discharged with prescription for Sterapred.  Return emergency department worsening.  Drink plenty of fluids over the next few days.  States he understands will comply.  Is discharged stable condition.  I do not think do the physical exam patient has acute pharyngitis.  I do feel he does have some anxiety related to his allergies.  Please follow-up with his regular doctors.    Bruce Woodard was evaluated in Emergency Department on 03/04/2020 for the symptoms described in the history of present illness. He was evaluated in the context of the global COVID-19 pandemic, which necessitated consideration that the patient might be at risk for infection with the SARS-CoV-2 virus that causes COVID-19. Institutional protocols and algorithms that pertain to the evaluation of  patients at risk for COVID-19 are in a state of rapid change based on information released by regulatory bodies including the CDC and federal and state organizations. These policies and algorithms were followed during the patient's care in the ED.   As part of my medical decision making, I reviewed the following data within the [place] History obtained from family, Nursing notes reviewed and incorporated, Old chart reviewed, Notes from prior ED visits and [place] Controlled Substance Database  ____________________________________________   FINAL CLINICAL IMPRESSION(S) / ED DIAGNOSES  Final diagnoses:  Allergic reaction, initial encounter      NEW MEDICATIONS STARTED DURING THIS VISIT:  New Prescriptions   PREDNISONE (STERAPRED UNI-PAK 21 TAB) 10 MG (21) TBPK TABLET    Take 6 pills on day one then decrease by 1 pill each day     Note:  This document was prepared using Dragon voice recognition software and  may include unintentional dictation errors.    Versie Starks, PA-C 03/04/20 1600    Caryn Section Linden Dolin, PA-C 03/04/20 1624    Lavonia Drafts, MD 03/05/20 1630

## 2020-03-04 NOTE — Discharge Instructions (Addendum)
Follow-up with your regular doctor as needed.  Return emergency department worsening.  Drink plenty of fluids.  Take Benadryl as needed.  Sterapred Dosepak start in the morning if needed.

## 2020-03-04 NOTE — ED Triage Notes (Signed)
Pt presents to ED via POV with c/o sore throat and poss allergic reaction. Pt states thinks he may have been exposed to allergen last night. Woke up this morning with itchy throat. Pt states took 40mg  Prednisone and 50mg  Benadryl PTA. Pt able to speak in full and complete sentences. Pt also c/o sneezing upon arrival. Pt visualized in NAD.

## 2020-07-05 ENCOUNTER — Other Ambulatory Visit: Payer: Self-pay

## 2020-08-13 ENCOUNTER — Emergency Department
Admission: EM | Admit: 2020-08-13 | Discharge: 2020-08-13 | Disposition: A | Discharge status: Home / Self Care | Payer: Self-pay | Attending: Emergency Medicine | Admitting: Emergency Medicine

## 2020-08-13 ENCOUNTER — Encounter: Payer: Self-pay | Admitting: Emergency Medicine

## 2020-08-13 DIAGNOSIS — J45909 Unspecified asthma, uncomplicated: Secondary | ICD-10-CM | POA: Insufficient documentation

## 2020-08-13 DIAGNOSIS — H60391 Other infective otitis externa, right ear: Secondary | ICD-10-CM | POA: Insufficient documentation

## 2020-08-13 DIAGNOSIS — K2900 Acute gastritis without bleeding: Secondary | ICD-10-CM | POA: Insufficient documentation

## 2020-08-13 DIAGNOSIS — Z91010 Allergy to peanuts: Secondary | ICD-10-CM | POA: Insufficient documentation

## 2020-08-13 DIAGNOSIS — K219 Gastro-esophageal reflux disease without esophagitis: Secondary | ICD-10-CM | POA: Insufficient documentation

## 2020-08-13 LAB — CBC
HCT: 42.5 % (ref 39.0–52.0)
Hemoglobin: 15.1 g/dL (ref 13.0–17.0)
MCH: 31.7 pg (ref 26.0–34.0)
MCHC: 35.5 g/dL (ref 30.0–36.0)
MCV: 89.3 fL (ref 80.0–100.0)
Platelets: 307 10*3/uL (ref 150–400)
RBC: 4.76 MIL/uL (ref 4.22–5.81)
RDW: 12 % (ref 11.5–15.5)
WBC: 9.5 10*3/uL (ref 4.0–10.5)
nRBC: 0 % (ref 0.0–0.2)

## 2020-08-13 LAB — URINALYSIS, COMPLETE (UACMP) WITH MICROSCOPIC
Bacteria, UA: NONE SEEN
Bilirubin Urine: NEGATIVE
Glucose, UA: NEGATIVE mg/dL
Hgb urine dipstick: NEGATIVE
Ketones, ur: NEGATIVE mg/dL
Leukocytes,Ua: NEGATIVE
Nitrite: NEGATIVE
Protein, ur: NEGATIVE mg/dL
Specific Gravity, Urine: 1.001 — ABNORMAL LOW (ref 1.005–1.030)
Squamous Epithelial / HPF: NONE SEEN (ref 0–5)
pH: 7 (ref 5.0–8.0)

## 2020-08-13 LAB — COMPREHENSIVE METABOLIC PANEL
ALT: 53 U/L — ABNORMAL HIGH (ref 0–44)
AST: 27 U/L (ref 15–41)
Albumin: 4.6 g/dL (ref 3.5–5.0)
Alkaline Phosphatase: 57 U/L (ref 38–126)
Anion gap: 9 (ref 5–15)
BUN: 12 mg/dL (ref 6–20)
CO2: 27 mmol/L (ref 22–32)
Calcium: 9.5 mg/dL (ref 8.9–10.3)
Chloride: 102 mmol/L (ref 98–111)
Creatinine, Ser: 1.23 mg/dL (ref 0.61–1.24)
GFR, Estimated: >60 mL/min (ref >60)
Glucose, Bld: 102 mg/dL — ABNORMAL HIGH (ref 70–99)
Potassium: 4.1 mmol/L (ref 3.5–5.1)
Sodium: 138 mmol/L (ref 135–145)
Total Bilirubin: 0.5 mg/dL (ref 0.3–1.2)
Total Protein: 7.6 g/dL (ref 6.5–8.1)

## 2020-08-13 LAB — LIPASE, BLOOD: Lipase: 43 U/L (ref 11–51)

## 2020-08-13 MED ORDER — EPINEPHRINE 0.3 MG/0.3ML IJ SOAJ: 0.3000 mg | Freq: Once | INTRAMUSCULAR | 0 refills | Status: AC

## 2020-08-13 MED ORDER — NEOMYCIN-POLYMYXIN-HC 3.5-10000-1 OT SOLN: 3.0000 [drp] | Freq: Three times a day (TID) | OTIC | 0 refills | Status: AC

## 2020-08-13 NOTE — ED Provider Notes (Signed)
[facility] Emergency Department Provider Note  ____________________________________________  Time seen: Approximately 1:02 PM  I have reviewed the triage vital signs and the nursing notes.   HISTORY  Chief Complaint Abdominal Pain    HPI Bruce Woodard is a 34 y.o. male who presents the emergency department complaining of epigastric and left upper quadrant abdominal pain.  Patient states that it has been intermittent over the past week.  He states that he has a history of same with a history of acid reflux.  Patient states that he used to be on Prilosec for these complaints.  Patient states that he has had some nausea but no emesis.  He also has significant food allergies, states that he had an allergic reaction 2 weeks ago after an accidental contact with tomatoes.  No ongoing GI symptoms.  No constipation.  Patient has seen no blood in the stools.  Pain is intermittent in nature, not present currently.  Patient has had periods of nausea but no emesis.  He started Prilosec today.  No other medications prior to arrival.   Patient also with like his right ear evaluated.  Patient states that he has had scab, pain to the ear canal on the right side.  This is been ongoing x3 weeks.  No drainage from the right ear.  No hearing changes.        Past Medical History:  Diagnosis Date  . Asthma    as a child    Patient Active Problem List   Diagnosis Date Noted  . 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 Laceration of extensor tendon of hand 11/23/2015    Past Surgical History:  Procedure Laterality Date  . REPAIR EXTENSOR TENDON Right 11/14/2015   Procedure: REPAIR EXTENSOR TENDON, 3rd and 5th;  Surgeon: Christena Flake, MD;  Location: [facility] ORS;  Service: Orthopedics;  Laterality: Right;    Prior to Admission medications   Medication Sig Start Date End Date Taking? Authorizing Provider  albuterol (PROVENTIL HFA;VENTOLIN HFA) 108 (90 Base) MCG/ACT inhaler Inhale 2 puffs into  the lungs every 6 (six) hours as needed. 07/28/18   Myrna Blazer, MD  buPROPion (WELLBUTRIN XL) 150 MG 24 hr tablet Take 150 mg by mouth daily. 05/21/19   [provider]  cetirizine (ZYRTEC) 10 MG tablet Take 10 mg by mouth daily.    [provider]  Diphenhyd-Hydrocort-Nystatin (FIRST-DUKES MOUTHWASH MT) Swish and spit 5 mLs every 6 (six) hours as needed for Pain Duke's Magic Mouthwash (contains the following ratios): Nystatin susp 100,000 units/mL = 30 mL Hydrocortisone = 60 mg. Viscus Xylocaine= 30 ml QS with diphenhydramine HCl syrup to volume of 240 mL 12/13/19   [provider]  diphenhydrAMINE (BENADRYL) 50 MG capsule Take 50 mg by mouth every 6 (six) hours as needed for itching.    [provider]  EPINEPHrine (EPIPEN 2-PAK) 0.3 mg/0.3 mL IJ SOAJ injection Inject 0.3 mLs (0.3 mg total) into the muscle as needed for up to 1 dose for anaphylaxis. 01/30/19   Dionne Bucy, MD  EPINEPHrine 0.3 mg/0.3 mL IJ SOAJ injection Inject 0.3 mg into the muscle once for 1 dose. 08/13/20 08/13/20  Yoselyn Mcglade, Delorise Royals, PA-C  fluticasone (FLONASE) 50 MCG/ACT nasal spray Place 2 sprays into both nostrils daily.    [provider]  neomycin-polymyxin-hydrocortisone (CORTISPORIN) OTIC solution Place 3 drops into the right ear 3 (three) times daily for 10 days. 08/13/20 08/23/20  Kerry-Anne Mezo, Delorise Royals, PA-C  predniSONE (STERAPRED UNI-PAK 21 TAB)  10 MG (21) TBPK tablet Take 6 pills on day one then decrease by 1 pill each day 03/04/20   Faythe GheeFisher, Susan W, PA-C    Allergies Shellfish allergy, Amoxicillin, Grass extracts [gramineae pollens], Peanut-containing drug products, Penicillins, and Tomato  Family History  Problem Relation Age of Onset  . Hypertension Mother   . Rheum arthritis Mother     Social History Social History   Tobacco Use  . Smoking status: Never Smoker  . Smokeless tobacco: Never Used  Vaping Use  . Vaping Use: Never used   Substance Use Topics  . Alcohol use: Yes    Comment: socially  . Drug use: No     Review of Systems  Constitutional: No fever/chills Eyes: No visual changes. No discharge ENT: Scabbing, pain to the right EAC Cardiovascular: no chest pain. Respiratory: no cough. No SOB. Gastrointestinal: Intermittent epigastric and left upper quadrant abdominal pain.  Positive nausea, no vomiting.  No diarrhea.  No constipation. Genitourinary: Negative for dysuria. No hematuria Musculoskeletal: Negative for musculoskeletal pain. Skin: Negative for rash, abrasions, lacerations, ecchymosis. Neurological: Negative for headaches, focal weakness or numbness.  10 System ROS otherwise negative.  ____________________________________________   PHYSICAL EXAM:  VITAL SIGNS: ED Triage Vitals  Enc Vitals Group     BP 08/13/20 1134 129/87     Pulse Rate 08/13/20 1134 72     Resp 08/13/20 1134 16     Temp 08/13/20 1134 97.7 F (36.5 C)     Temp src --      SpO2 08/13/20 1134 100 %     Weight 08/13/20 1135 185 lb (83.9 kg)     Height 08/13/20 1135 5\' 11"  (1.803 m)     Head Circumference --      Peak Flow --      Pain Score 08/13/20 1135 10     Pain Loc --      Pain Edu? --      Excl. in GC? --      Constitutional: Alert and oriented. Well appearing and in no acute distress. Eyes: Conjunctivae are normal. PERRL. EOMI. Head: Atraumatic. ENT:      Ears: Visualization of the left EAC and TM is unremarkable.  Right EAC has some scabbing, erythema along the distal aspect of the EAC.  TM on the right side is unremarkable      Nose: No congestion/rhinnorhea.      Mouth/Throat: Mucous membranes are moist.  Neck: No stridor.  Neck is supple [place] motion Hematological/Lymphatic/Immunilogical: No cervical lymphadenopathy. Cardiovascular: Normal rate, regular rhythm. Normal S1 and S2.  Good peripheral circulation. Respiratory: Normal respiratory effort without tachypnea or retractions. Lungs CTAB. Good  air entry to the bases with no decreased or absent breath sounds. Gastrointestinal: No external abdominal wall findings.  Bowel sounds 4 quadrants. Soft and nontender to palpation. No guarding or rigidity. No palpable masses. No distention. No CVA tenderness. Musculoskeletal: Full range of motion to all extremities. No gross deformities appreciated. Neurologic:  Normal speech and language. No gross focal neurologic deficits are appreciated.  Skin:  Skin is warm, dry and intact. No rash noted. Psychiatric: Mood and affect are normal. Speech and behavior are normal. Patient exhibits appropriate insight and judgement.   ____________________________________________   LABS (all labs ordered are listed, but only abnormal results are displayed)  Labs Reviewed  COMPREHENSIVE METABOLIC PANEL - Abnormal; Notable for the following components:      Result Value   Glucose, Bld 102 (*)    ALT  53 (*)    All other components within normal limits  URINALYSIS, COMPLETE (UACMP) WITH MICROSCOPIC - Abnormal; Notable for the following components:   Color, Urine COLORLESS (*)    APPearance CLEAR (*)    Specific Gravity, Urine 1.001 (*)    All other components within normal limits  LIPASE, BLOOD  CBC   ____________________________________________  EKG   ____________________________________________  RADIOLOGY   No results found.  ____________________________________________    PROCEDURES  Procedure(s) performed:    Procedures    Medications - No data to display   ____________________________________________   INITIAL IMPRESSION / ASSESSMENT AND PLAN / ED COURSE  Pertinent labs & imaging results that were available during my care of the patient were reviewed by me and considered in my medical decision making (see chart for details).  Review of the [place] CSRS was performed in accordance of the [facility] prior to dispensing any controlled drugs.           Patient's diagnosis is  consistent with GERD/gastritis, otitis externa.  Patient presented to the emergency department complaining of intermittent epigastric and left abdominal pain as well as lesion to the right ear.  Patient states that he has a history of GERD, similar symptoms in the past as well as significant food allergies.  Review of the patient's medical record I was able to see multiple encounters over the past several years for allergic reaction to food, most recently in May of this year.  I was unable to identify patient's encounter for allergy testing.  Patient states that he has a history of GERD, has been on omeprazole in the past for same.  Overall today labs are reassuring.  Exam was reassuring with no acute tenderness.  Based off of patient's symptoms, I feel that this is likely gastritis/GERD.  Patient just started omeprazole today and is instructed to continue same.  We have discussed about management of GERD symptoms including eating right before bed, multiple small meals throughout the day, avoiding acidic and spicy foods.  At this time there is no indication for imaging as labs are reassuring, exam is reassuring.  Return precautions are discussed with the patient however.  Findings on physical exam are consistent with otitis externa to the right ear.  Patient will be placed on antibiotic drops for same..  Follow-up with primary care as needed.  Patient is requested that I write a prescription for his EpiPen as his EpiPen has expired 2 months ago.  This will be prescribed at this time.  Patient is given ED precautions to return to the ED for any worsening or new symptoms.     ____________________________________________  FINAL CLINICAL IMPRESSION(S) / ED DIAGNOSES  Final diagnoses:  Acute gastritis without hemorrhage, unspecified gastritis type  Gastroesophageal reflux disease, unspecified whether esophagitis present  Other infective acute otitis externa of right ear      NEW MEDICATIONS STARTED  DURING THIS VISIT:  ED Discharge Orders         Ordered    neomycin-polymyxin-hydrocortisone (CORTISPORIN) OTIC solution  3 times daily        08/13/20 1339    EPINEPHrine 0.3 mg/0.3 mL IJ SOAJ injection   Once        08/13/20 1339              This chart was dictated using voice recognition software/Dragon. Despite best efforts to proofread, errors can occur which can change the meaning. Any change was purely unintentional.    Tigran Haynie, Delorise Royals,  PA-C 08/13/20 1340    Jene Every, MD 08/13/20 1348

## 2020-08-13 NOTE — ED Triage Notes (Signed)
Pt to ED via POV c/o burning pain in his LUQ x 1 week. Pt denies N/V. Pt is in NAD.

## 2020-08-13 NOTE — ED Notes (Signed)
Pt presents to the ED for LUQ abdominal pain that started last Sunday. Pt has had some nausea but denies vomiting, SOB, or fevers. Denies any abdominal surgical hx. Pt is A&Ox4 and NAD. Ambulatory to room

## 2020-08-31 ENCOUNTER — Emergency Department: Payer: Self-pay

## 2020-08-31 ENCOUNTER — Emergency Department
Admission: EM | Admit: 2020-08-31 | Discharge: 2020-08-31 | Disposition: A | Discharge status: Home / Self Care | Payer: Self-pay | Attending: Emergency Medicine | Admitting: Emergency Medicine

## 2020-08-31 ENCOUNTER — Other Ambulatory Visit: Payer: Self-pay

## 2020-08-31 DIAGNOSIS — R519 Headache, unspecified: Secondary | ICD-10-CM | POA: Insufficient documentation

## 2020-08-31 DIAGNOSIS — R0981 Nasal congestion: Secondary | ICD-10-CM | POA: Insufficient documentation

## 2020-08-31 DIAGNOSIS — R42 Dizziness and giddiness: Secondary | ICD-10-CM | POA: Insufficient documentation

## 2020-08-31 DIAGNOSIS — K047 Periapical abscess without sinus: Secondary | ICD-10-CM | POA: Insufficient documentation

## 2020-08-31 DIAGNOSIS — Z20822 Contact with and (suspected) exposure to covid-19: Secondary | ICD-10-CM | POA: Insufficient documentation

## 2020-08-31 DIAGNOSIS — R197 Diarrhea, unspecified: Secondary | ICD-10-CM | POA: Insufficient documentation

## 2020-08-31 DIAGNOSIS — J45909 Unspecified asthma, uncomplicated: Secondary | ICD-10-CM | POA: Insufficient documentation

## 2020-08-31 DIAGNOSIS — Z91010 Allergy to peanuts: Secondary | ICD-10-CM | POA: Insufficient documentation

## 2020-08-31 LAB — RESPIRATORY PANEL BY RT PCR (FLU A&B, COVID)
Influenza A by PCR: NEGATIVE
Influenza B by PCR: NEGATIVE
SARS Coronavirus 2 by RT PCR: NEGATIVE

## 2020-08-31 MED ORDER — KETOROLAC TROMETHAMINE 30 MG/ML IJ SOLN
30.0000 mg | Freq: Once | INTRAMUSCULAR | Status: DC
  Filled 2020-08-31: qty 1

## 2020-08-31 MED ORDER — IBUPROFEN 800 MG PO TABS: 800.0000 mg | ORAL_TABLET | Freq: Once | ORAL | Status: DC

## 2020-08-31 MED ORDER — ACETAMINOPHEN 500 MG PO TABS
1000.0000 mg | ORAL_TABLET | Freq: Once | ORAL | Status: AC
  Administered 2020-08-31: 1000 mg via ORAL
  Filled 2020-08-31: qty 2

## 2020-08-31 NOTE — ED Provider Notes (Signed)
Emergency Department Provider Note  ____________________________________________  Time seen: Approximately 3:39 PM  I have reviewed the triage vital signs and the nursing notes.   HISTORY  Chief Complaint Headache   Historian Patient     HPI Bruce Woodard is a 34 y.o. male presents to the emergency department with atypical headache. Patient states that headache is frontal in nature and progressed in intensity slowly. He states that he has had an infected tooth along the right lower jaw and is currently taking clindamycin. He states that as of right now, his pain is well controlled. He states that he has had some dizziness when going from a sitting to standing position and states that he feels off balance. He denies weakness in the upper and lower extremities. No chest pain, chest tightness or abdominal pain. He has had some diarrhea which he associates with clindamycin use. No other alleviating measures have been attempted.   Past Medical History:  Diagnosis Date  . Asthma    as a child     Immunizations up to date:  Yes.     Past Medical History:  Diagnosis Date  . Asthma    as a child    Patient Active Problem List   Diagnosis Date Noted  . 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 Laceration of extensor tendon of hand 11/23/2015    Past Surgical History:  Procedure Laterality Date  . REPAIR EXTENSOR TENDON Right 11/14/2015   Procedure: REPAIR EXTENSOR TENDON, 3rd and 5th;  Surgeon: Christena Flake, MD;  Location: [facility] ORS;  Service: Orthopedics;  Laterality: Right;    Prior to Admission medications   Medication Sig Start Date End Date Taking? Authorizing Provider  albuterol (PROVENTIL HFA;VENTOLIN HFA) 108 (90 Base) MCG/ACT inhaler Inhale 2 puffs into the lungs every 6 (six) hours as needed. 07/28/18   Myrna Blazer, MD  buPROPion (WELLBUTRIN XL) 150 MG 24 hr tablet Take 150 mg by mouth daily. 05/21/19   [provider]  cetirizine (ZYRTEC) 10 MG tablet  Take 10 mg by mouth daily.    [provider]  Diphenhyd-Hydrocort-Nystatin (FIRST-DUKES MOUTHWASH MT) Swish and spit 5 mLs every 6 (six) hours as needed for Pain Duke's Magic Mouthwash (contains the following ratios): Nystatin susp 100,000 units/mL = 30 mL Hydrocortisone = 60 mg. Viscus Xylocaine= 30 ml QS with diphenhydramine HCl syrup to volume of 240 mL 12/13/19   [provider]  diphenhydrAMINE (BENADRYL) 50 MG capsule Take 50 mg by mouth every 6 (six) hours as needed for itching.    [provider]  EPINEPHrine (EPIPEN 2-PAK) 0.3 mg/0.3 mL IJ SOAJ injection Inject 0.3 mLs (0.3 mg total) into the muscle as needed for up to 1 dose for anaphylaxis. 01/30/19   Dionne Bucy, MD  fluticasone (FLONASE) 50 MCG/ACT nasal spray Place 2 sprays into both nostrils daily.    [provider]  predniSONE (STERAPRED UNI-PAK 21 TAB) 10 MG (21) TBPK tablet Take 6 pills on day one then decrease by 1 pill each day 03/04/20   Faythe Ghee, PA-C    Allergies Shellfish allergy, Amoxicillin, Grass extracts [gramineae pollens], Peanut-containing drug products, Penicillins, and Tomato  Family History  Problem Relation Age of Onset  . Hypertension Mother   . Rheum arthritis Mother     Social History Social History   Tobacco Use  . Smoking status: Never Smoker  . Smokeless tobacco: Never Used  Vaping Use  . Vaping Use: Never used  Substance Use Topics  .  Alcohol use: Yes    Comment: socially  . Drug use: No     Review of Systems  Constitutional: No fever/chills Eyes:  No discharge ENT: Patient has nasal congestion.  Respiratory: no cough. No SOB/ use of accessory muscles to breath Gastrointestinal:   No nausea, no vomiting.  No diarrhea.  No constipation. Musculoskeletal: Negative for musculoskeletal pain. Neuro: Patient has headache.  Skin: Negative for rash, abrasions, lacerations,  ecchymosis.    ____________________________________________   PHYSICAL EXAM:  VITAL SIGNS: ED Triage Vitals  Enc Vitals Group     BP 08/31/20 1415 114/79     Pulse Rate 08/31/20 1415 85     Resp 08/31/20 1415 16     Temp 08/31/20 1415 98 F (36.7 C)     Temp Source 08/31/20 1415 Oral     SpO2 08/31/20 1415 100 %     Weight 08/31/20 1417 185 lb (83.9 kg)     Height 08/31/20 1417 5\' 11"  (1.803 m)     Head Circumference --      Peak Flow --      Pain Score 08/31/20 1417 10     Pain Loc --      Pain Edu? --      Excl. in GC? --      Constitutional: Alert and oriented. Well appearing and in no acute distress. Eyes: Conjunctivae are normal. PERRL. EOMI. Head: Atraumatic. ENT:      Ears:       Nose: No congestion/rhinnorhea.      Mouth/Throat: Mucous membranes are moist.  Neck: No stridor.  Full range of motion  Cardiovascular: Normal rate, regular rhythm. Normal S1 and S2.  Good peripheral circulation. Respiratory: Normal respiratory effort without tachypnea or retractions. Lungs CTAB. Good air entry to the bases with no decreased or absent breath sounds Gastrointestinal: Bowel sounds x 4 quadrants. Soft and nontender to palpation. No guarding or rigidity. No distention. Musculoskeletal: Full range of motion to all extremities. No obvious deformities noted Neurologic:  Normal for age. No gross focal neurologic deficits are appreciated.  Skin:  Skin is warm, dry and intact. No rash noted. Psychiatric: Mood and affect are normal for age. Speech and behavior are normal.   ____________________________________________   LABS (all labs ordered are listed, but only abnormal results are displayed)  Labs Reviewed  RESPIRATORY PANEL BY RT PCR (FLU A&B, COVID)   ____________________________________________  EKG   ____________________________________________  RADIOLOGY 13/11/21, personally viewed and evaluated these images (plain radiographs) as part of my medical  decision making, as well as reviewing the written report by the radiologist.    CT Head Wo Contrast  Result Date: 08/31/2020 CLINICAL DATA:  Headache for 1 week, dental pain EXAM: CT HEAD WITHOUT CONTRAST TECHNIQUE: Contiguous axial images were obtained from the base of the skull through the vertex without intravenous contrast. COMPARISON:  08/07/2007 FINDINGS: Brain: No acute infarct or hemorrhage. The lateral ventricles and midline structures are stable. No acute extra-axial fluid collections. No mass effect. Vascular: No hyperdense vessel or unexpected calcification. Skull: Normal. Negative for fracture or focal lesion. Sinuses/Orbits: No acute finding. Other: None. IMPRESSION: 1. Stable head CT, no acute process. Electronically Signed   By: 08/09/2007 M.D.   On: 08/31/2020 16:03    ____________________________________________    PROCEDURES  Procedure(s) performed:     Procedures     Medications  acetaminophen (TYLENOL) tablet 1,000 mg (1,000 mg Oral Given 08/31/20 1624)     ____________________________________________  INITIAL IMPRESSION / ASSESSMENT AND PLAN / ED COURSE  Pertinent labs & imaging results that were available during my care of the patient were reviewed by me and considered in my medical decision making (see chart for details).      Assessment and Plan:  Headache Nasal congestion 34 year old male presents to the emergency department with atypical headache for the past week, nasal congestion and some diarrhea.  Patient states that he is currently taking clindamycin for a lower jaw dental abscess.  Vital signs are reassuring at triage.  On physical exam, patient no neuro deficits noted.  Patient tested negative for COVID-19 and influenza.  CT head revealed no evidence of intracranial bleed.  Patient was given Tylenol in the emergency department and was advised to continue taking clindamycin as directed by dentist.  Return precautions were given to  return with new or worsening symptoms.  All patient questions were answered.    ____________________________________________  FINAL CLINICAL IMPRESSION(S) / ED DIAGNOSES  Final diagnoses:  Acute nonintractable headache, unspecified headache type      NEW MEDICATIONS STARTED DURING THIS VISIT:  ED Discharge Orders    None          This chart was dictated using voice recognition software/Dragon. Despite best efforts to proofread, errors can occur which can change the meaning. Any change was purely unintentional.     Orvil Feil, PA-C 08/31/20 1732    Minna Antis, MD 08/31/20 2153

## 2020-08-31 NOTE — Discharge Instructions (Signed)
Continue taking Tylenol and ibuprofen alternating for headache.

## 2020-08-31 NOTE — ED Triage Notes (Signed)
Pt reports headache starting 1 week ago. Pt reports dental pain that he is scheduled to see a oral surgeon for the pain. NAD noted at this time. NAD noted at this time.

## 2020-10-21 ENCOUNTER — Other Ambulatory Visit: Payer: Self-pay

## 2020-10-21 ENCOUNTER — Encounter: Payer: Self-pay | Admitting: Emergency Medicine

## 2020-10-21 ENCOUNTER — Emergency Department
Admission: EM | Admit: 2020-10-21 | Discharge: 2020-10-21 | Disposition: A | Discharge status: Home / Self Care | Payer: BC Managed Care – PPO | Source: Home / Self Care | Attending: Emergency Medicine | Admitting: Emergency Medicine

## 2020-10-21 ENCOUNTER — Emergency Department
Admission: EM | Admit: 2020-10-21 | Discharge: 2020-10-21 | Disposition: A | Discharge status: Home / Self Care | Payer: Self-pay | Attending: Emergency Medicine | Admitting: Emergency Medicine

## 2020-10-21 DIAGNOSIS — T7840XA Allergy, unspecified, initial encounter: Secondary | ICD-10-CM | POA: Insufficient documentation

## 2020-10-21 DIAGNOSIS — Z91010 Allergy to peanuts: Secondary | ICD-10-CM | POA: Insufficient documentation

## 2020-10-21 DIAGNOSIS — R0981 Nasal congestion: Secondary | ICD-10-CM | POA: Insufficient documentation

## 2020-10-21 DIAGNOSIS — J029 Acute pharyngitis, unspecified: Secondary | ICD-10-CM | POA: Diagnosis present

## 2020-10-21 DIAGNOSIS — J45909 Unspecified asthma, uncomplicated: Secondary | ICD-10-CM | POA: Insufficient documentation

## 2020-10-21 DIAGNOSIS — Z20822 Contact with and (suspected) exposure to covid-19: Secondary | ICD-10-CM | POA: Insufficient documentation

## 2020-10-21 DIAGNOSIS — Z5321 Procedure and treatment not carried out due to patient leaving prior to being seen by health care provider: Secondary | ICD-10-CM | POA: Insufficient documentation

## 2020-10-21 MED ORDER — DEXAMETHASONE SODIUM PHOSPHATE 10 MG/ML IJ SOLN
10.0000 mg | Freq: Once | INTRAMUSCULAR | Status: DC
  Filled 2020-10-21: qty 1

## 2020-10-21 MED ORDER — PREDNISONE 10 MG (21) PO TBPK: ORAL_TABLET | ORAL | 0 refills | Status: DC

## 2020-10-21 NOTE — ED Triage Notes (Signed)
Pt c/o throat "itching" and a white spot on the right side of tongue. Pt denies eating allergy based foods. Per pt, he started to have these symptoms last night. On assessment, pts throat is clear and pink, no airway obstruction. No visible white spot on tongue seen by Clinical research associate.

## 2020-10-21 NOTE — ED Notes (Signed)
Patient expressing concerns for throat "feeling tight". ED PA informed, medication ordered. Patient refused and was discharged.

## 2020-10-21 NOTE — ED Notes (Signed)
Pt and visitor in room. Room smells highly suspicious for marijuana use

## 2020-10-21 NOTE — ED Triage Notes (Addendum)
Pt arrived via POV with reports of itchy throat, sneezing, irritated throat. Pt states his throat has been bothering him since yesterday. Concerned for possible allergy to seasonings.  Pt also noted to have some nasal congestion.  Pt states he took 25mg  Benadryl prior to arrival.  Pt states his parents dx with COVID 2 weeks ago, states he has been around them.

## 2020-10-21 NOTE — ED Triage Notes (Signed)
Pt called x 3 in ED, pt not visualized in ED or outside at this time.

## 2020-10-21 NOTE — Discharge Instructions (Signed)
He has been given a prescription for steroid pack.  This is to help with ear suspected allergic reaction and suspected Covid. Return emergency department for

## 2020-10-21 NOTE — ED Provider Notes (Signed)
[facility] Emergency Department Provider Note  ____________________________________________   Event Date/Time   First MD Initiated Contact with Patient 10/21/20 2013     (approximate)  I have reviewed the triage vital signs and the nursing notes.   HISTORY  Chief Complaint Allergic Reaction    HPI Bruce Woodard is a 35 y.o. male presents emergency department complaining of a white spot on the right side of his tongue.  Thinks he is having an allergic reaction to the food that he ate.  No difficulty breathing or throat swelling but states his throat feels a little scratchy and tight.  Patient was also exposed to a Covid positive person.  He has had some body aches.  No fever or chills.    Past Medical History:  Diagnosis Date  . Asthma    as a child    Patient Active Problem List   Diagnosis Date Noted  . 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 Laceration of extensor tendon of hand 11/23/2015    Past Surgical History:  Procedure Laterality Date  . REPAIR EXTENSOR TENDON Right 11/14/2015   Procedure: REPAIR EXTENSOR TENDON, 3rd and 5th;  Surgeon: Corky Mull, MD;  Location: [facility] ORS;  Service: Orthopedics;  Laterality: Right;    Prior to Admission medications   Medication Sig Start Date End Date Taking? Authorizing Provider  albuterol (PROVENTIL HFA;VENTOLIN HFA) 108 (90 Base) MCG/ACT inhaler Inhale 2 puffs into the lungs every 6 (six) hours as needed. 07/28/18   Orbie Pyo, MD  buPROPion (WELLBUTRIN XL) 150 MG 24 hr tablet Take 150 mg by mouth daily. 05/21/19   [provider]  cetirizine (ZYRTEC) 10 MG tablet Take 10 mg by mouth daily.    [provider]  Diphenhyd-Hydrocort-Nystatin (FIRST-DUKES MOUTHWASH MT) Swish and spit 5 mLs every 6 (six) hours as needed for Pain Duke's Magic Mouthwash (contains the following ratios): Nystatin susp 100,000 units/mL = 30 mL Hydrocortisone = 60 mg. Viscus Xylocaine= 30 ml QS with  diphenhydramine HCl syrup to volume of 240 mL 12/13/19   [provider]  diphenhydrAMINE (BENADRYL) 50 MG capsule Take 50 mg by mouth every 6 (six) hours as needed for itching.    [provider]  EPINEPHrine (EPIPEN 2-PAK) 0.3 mg/0.3 mL IJ SOAJ injection Inject 0.3 mLs (0.3 mg total) into the muscle as needed for up to 1 dose for anaphylaxis. 01/30/19   Arta Silence, MD  fluticasone (FLONASE) 50 MCG/ACT nasal spray Place 2 sprays into both nostrils daily.    [provider]  predniSONE (STERAPRED UNI-PAK 21 TAB) 10 MG (21) TBPK tablet Take 6 pills on day one then decrease by 1 pill each day 10/21/20   Versie Starks, PA-C    Allergies Peanut oil, Penicillins, Shellfish allergy, Shrimp extract allergy skin test, Amoxicillin, Grass extracts [gramineae pollens], Peanut-containing drug products, and Tomato  Family History  Problem Relation Age of Onset  . Hypertension Mother   . Rheum arthritis Mother     Social History Social History   Tobacco Use  . Smoking status: Never Smoker  . Smokeless tobacco: Never Used  Vaping Use  . Vaping Use: Never used  Substance Use Topics  . Alcohol use: Yes    Comment: socially  . Drug use: No    Review of Systems  Constitutional: No fever/chills Eyes: No visual changes. ENT: No sore throat. Respiratory: Denies cough Cardiovascular: Denies chest pain Gastrointestinal: Denies abdominal pain Genitourinary: Negative for dysuria. Musculoskeletal: Negative for  back pain. Skin: Negative for rash. Psychiatric: no mood changes,     ____________________________________________   PHYSICAL EXAM:  VITAL SIGNS: ED Triage Vitals  Enc Vitals Group     BP 10/21/20 1930 119/75     Pulse Rate 10/21/20 1930 89     Resp 10/21/20 1930 17     Temp 10/21/20 1930 98.5 F (36.9 C)     Temp Source 10/21/20 1930 Oral     SpO2 10/21/20 1930 97 %     Weight 10/21/20 1931 181 lb (82.1 kg)     Height 10/21/20 1931 5\' 11"   (1.803 m)     Head Circumference --      Peak Flow --      Pain Score 10/21/20 2037 10     Pain Loc --      Pain Edu? --      Excl. in GC? --     Constitutional: Alert and oriented. Well appearing and in no acute distress. Eyes: Conjunctivae are normal.  Head: Atraumatic. Nose: No congestion/rhinnorhea. Mouth/Throat: Mucous membranes are moist.  Airway is patent, no swelling noted in the mouth Neck:  supple no lymphadenopathy noted Cardiovascular: Normal rate, regular rhythm. Heart sounds are normal Respiratory: Normal respiratory effort.  No retractions, lungs c t a  Abd: soft nontender bs normal all 4 quad GU: deferred Musculoskeletal: FROM all extremities, warm and well perfused Neurologic:  Normal speech and language.  Skin:  Skin is warm, dry and intact. No rash noted. Psychiatric: Mood and affect are normal. Speech and behavior are normal.  ____________________________________________   LABS (all labs ordered are listed, but only abnormal results are displayed)  Labs Reviewed  SARS CORONAVIRUS 2 (TAT 6-24 HRS)   ____________________________________________   ____________________________________________  RADIOLOGY    ____________________________________________   PROCEDURES  Procedure(s) performed: No  Procedures    ____________________________________________   INITIAL IMPRESSION / ASSESSMENT AND PLAN / ED COURSE  Pertinent labs & imaging results that were available during my care of the patient were reviewed by me and considered in my medical decision making (see chart for details).   The patient is 35 year old male presents emergency department with concerns of allergic reaction versus Covid.  See HPI.  Physical exam is unremarkable.  Patient is adamant that he gets steroids due to his allergic reaction.  States he knows his body.  Tried to explain to him that at this time since he was here earlier and left and now has come back in the entire room  smells of marijuana that I do not feel he is having a severe allergic reaction.  However we did offer a Decadron injection which he then refused.  He is to take over-the-counter Benadryl overnight.  Is given a prescription for Sterapred.  Covid test was sent.  He was discharged stable condition.     Bruce Woodard was evaluated in Emergency Department on 10/21/2020 for the symptoms described in the history of present illness. He was evaluated in the context of the global COVID-19 pandemic, which necessitated consideration that the patient might be at risk for infection with the SARS-CoV-2 virus that causes COVID-19. Institutional protocols and algorithms that pertain to the evaluation of patients at risk for COVID-19 are in a state of rapid change based on information released by regulatory bodies including the CDC and federal and state organizations. These policies and algorithms were followed during the patient's care in the ED.    As part of my medical decision making, I  reviewed the following data within the electronic MEDICAL RECORD NUMBER Nursing notes reviewed and incorporated, Old chart reviewed, Notes from prior ED visits and [place] Controlled Substance Database  ____________________________________________   FINAL CLINICAL IMPRESSION(S) / ED DIAGNOSES  Final diagnoses:  Suspected COVID-19 virus infection      NEW MEDICATIONS STARTED DURING THIS VISIT:  Discharge Medication List as of 10/21/2020  8:24 PM       Note:  This document was prepared using Dragon voice recognition software and may include unintentional dictation errors.    Faythe Ghee, PA-C 10/21/20 0076    Sharman Cheek, MD 10/22/20 [phone]

## 2020-10-22 LAB — SARS CORONAVIRUS 2 (TAT 6-24 HRS): SARS Coronavirus 2: NEGATIVE

## 2020-11-29 ENCOUNTER — Emergency Department
Admission: EM | Admit: 2020-11-29 | Discharge: 2020-11-29 | Disposition: A | Discharge status: Home / Self Care | Payer: BLUE CROSS/BLUE SHIELD | Attending: Emergency Medicine | Admitting: Emergency Medicine

## 2020-11-29 ENCOUNTER — Other Ambulatory Visit: Payer: Self-pay

## 2020-11-29 ENCOUNTER — Encounter: Payer: Self-pay | Admitting: *Deleted

## 2020-11-29 DIAGNOSIS — M542 Cervicalgia: Secondary | ICD-10-CM | POA: Diagnosis not present

## 2020-11-29 DIAGNOSIS — Z8616 Personal history of COVID-19: Secondary | ICD-10-CM | POA: Insufficient documentation

## 2020-11-29 DIAGNOSIS — Z91010 Allergy to peanuts: Secondary | ICD-10-CM | POA: Insufficient documentation

## 2020-11-29 DIAGNOSIS — X58XXXA Exposure to other specified factors, initial encounter: Secondary | ICD-10-CM | POA: Diagnosis not present

## 2020-11-29 DIAGNOSIS — J45909 Unspecified asthma, uncomplicated: Secondary | ICD-10-CM | POA: Diagnosis not present

## 2020-11-29 DIAGNOSIS — S34109A Unspecified injury to unspecified level of lumbar spinal cord, initial encounter: Secondary | ICD-10-CM | POA: Diagnosis present

## 2020-11-29 DIAGNOSIS — S39012A Strain of muscle, fascia and tendon of lower back, initial encounter: Secondary | ICD-10-CM | POA: Insufficient documentation

## 2020-11-29 LAB — URINALYSIS, COMPLETE (UACMP) WITH MICROSCOPIC
Bacteria, UA: NONE SEEN
Bilirubin Urine: NEGATIVE
Glucose, UA: NEGATIVE mg/dL
Hgb urine dipstick: NEGATIVE
Ketones, ur: NEGATIVE mg/dL
Leukocytes,Ua: NEGATIVE
Nitrite: NEGATIVE
Protein, ur: NEGATIVE mg/dL
Specific Gravity, Urine: 1.005 (ref 1.005–1.030)
pH: 7 (ref 5.0–8.0)

## 2020-11-29 MED ORDER — METHOCARBAMOL 500 MG PO TABS: 500.0000 mg | ORAL_TABLET | Freq: Four times a day (QID) | ORAL | 0 refills | Status: DC

## 2020-11-29 MED ORDER — MELOXICAM 7.5 MG PO TABS
15.0000 mg | ORAL_TABLET | Freq: Once | ORAL | Status: AC
  Administered 2020-11-29: 15 mg via ORAL
  Filled 2020-11-29: qty 2

## 2020-11-29 MED ORDER — METHOCARBAMOL 500 MG PO TABS
1000.0000 mg | ORAL_TABLET | Freq: Once | ORAL | Status: AC
  Administered 2020-11-29: 1000 mg via ORAL
  Filled 2020-11-29: qty 2

## 2020-11-29 MED ORDER — MELOXICAM 15 MG PO TABS: 15.0000 mg | ORAL_TABLET | Freq: Every day | ORAL | 0 refills | Status: DC

## 2020-11-29 NOTE — ED Triage Notes (Addendum)
Pt to ED reporting lower back pain since Saturday. Pain is worse when laying flat. No dysuria or changes in his urine. Pt taking antibiotics currently for a tooth infection. No NVD or fevers No injuries reported.

## 2020-11-29 NOTE — ED Provider Notes (Signed)
[facility] Emergency Department Provider Note  ____________________________________________  Time seen: Approximately 9:06 PM  I have reviewed the triage vital signs and the nursing notes.   HISTORY  Chief Complaint Back Pain    HPI Bruce Woodard is a 35 y.o. male who presents the emergency department complaining of lower back pain bilaterally.  Patient states that over the past couple days he has had increased back pain.  No radiation.  Patient denies any urinary symptoms or GI symptoms.  Patient states that he just recovered from Covid, went to a new job and has developed lower back pain.  Again no history of same.  No medications prior to arrival.  Patient has tried a heating pad with no relief.         Past Medical History:  Diagnosis Date  . Asthma    as a child    Patient Active Problem List   Diagnosis Date Noted  . 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 Laceration of extensor tendon of hand 11/23/2015    Past Surgical History:  Procedure Laterality Date  . REPAIR EXTENSOR TENDON Right 11/14/2015   Procedure: REPAIR EXTENSOR TENDON, 3rd and 5th;  Surgeon: Christena Flake, MD;  Location: [facility] ORS;  Service: Orthopedics;  Laterality: Right;    Prior to Admission medications   Medication Sig Start Date End Date Taking? Authorizing Provider  meloxicam (MOBIC) 15 MG tablet Take 1 tablet (15 mg total) by mouth daily. 11/29/20  Yes Randi [employer], Delorise Royals, PA-C  methocarbamol (ROBAXIN) 500 MG tablet Take 1 tablet (500 mg total) by mouth 4 (four) times daily. 11/29/20  Yes Ember Henrikson, Delorise Royals, PA-C  albuterol (PROVENTIL HFA;VENTOLIN HFA) 108 (90 Base) MCG/ACT inhaler Inhale 2 puffs into the lungs every 6 (six) hours as needed. 07/28/18   Myrna Blazer, MD  buPROPion (WELLBUTRIN XL) 150 MG 24 hr tablet Take 150 mg by mouth daily. 05/21/19   [provider]  cetirizine (ZYRTEC) 10 MG tablet Take 10 mg by mouth daily.    [provider]   Diphenhyd-Hydrocort-Nystatin (FIRST-DUKES MOUTHWASH MT) Swish and spit 5 mLs every 6 (six) hours as needed for Pain Duke's Magic Mouthwash (contains the following ratios): Nystatin susp 100,000 units/mL = 30 mL Hydrocortisone = 60 mg. Viscus Xylocaine= 30 ml QS with diphenhydramine HCl syrup to volume of 240 mL 12/13/19   [provider]  diphenhydrAMINE (BENADRYL) 50 MG capsule Take 50 mg by mouth every 6 (six) hours as needed for itching.    [provider]  EPINEPHrine (EPIPEN 2-PAK) 0.3 mg/0.3 mL IJ SOAJ injection Inject 0.3 mLs (0.3 mg total) into the muscle as needed for up to 1 dose for anaphylaxis. 01/30/19   Dionne Bucy, MD  fluticasone (FLONASE) 50 MCG/ACT nasal spray Place 2 sprays into both nostrils daily.    [provider]  predniSONE (STERAPRED UNI-PAK 21 TAB) 10 MG (21) TBPK tablet Take 6 pills on day one then decrease by 1 pill each day 10/21/20   Faythe Ghee, PA-C    Allergies Peanut oil, Penicillins, Shellfish allergy, Shrimp extract allergy skin test, Amoxicillin, Grass extracts [gramineae pollens], Peanut-containing drug products, and Tomato  Family History  Problem Relation Age of Onset  . Hypertension Mother   . Rheum arthritis Mother     Social History Social History   Tobacco Use  . Smoking status: Never Smoker  . Smokeless tobacco: Never Used  Vaping Use  . Vaping Use: Never used  Substance Use Topics  .  Alcohol use: Yes    Comment: socially  . Drug use: No     Review of Systems  Constitutional: No fever/chills Eyes: No visual changes. No discharge ENT: No upper respiratory complaints. Cardiovascular: no chest pain. Respiratory: no cough. No SOB. Gastrointestinal: No abdominal pain.  No nausea, no vomiting.  No diarrhea.  No constipation. Genitourinary: Negative for dysuria. No hematuria Musculoskeletal: Positive for bilateral lower back pain Skin: Negative for rash, abrasions, lacerations,  ecchymosis. Neurological: Negative for headaches, focal weakness or numbness.  10 System ROS otherwise negative.  ____________________________________________   PHYSICAL EXAM:  VITAL SIGNS: ED Triage Vitals  Enc Vitals Group     BP 11/29/20 1909 (!) 128/91     Pulse Rate 11/29/20 1909 72     Resp 11/29/20 1909 16     Temp 11/29/20 1909 98.1 F (36.7 C)     Temp Source 11/29/20 1909 Oral     SpO2 11/29/20 1909 100 %     Weight 11/29/20 1910 181 lb (82.1 kg)     Height 11/29/20 1910 5\' 11"  (1.803 m)     Head Circumference --      Peak Flow --      Pain Score 11/29/20 1910 10     Pain Loc --      Pain Edu? --      Excl. in GC? --      Constitutional: Alert and oriented. Well appearing and in no acute distress. Eyes: Conjunctivae are normal. PERRL. EOMI. Head: Atraumatic. ENT:      Ears:       Nose: No congestion/rhinnorhea.      Mouth/Throat: Mucous membranes are moist.  Neck: No stridor.    Cardiovascular: Normal rate, regular rhythm. Normal S1 and S2.  Good peripheral circulation. Respiratory: Normal respiratory effort without tachypnea or retractions. Lungs CTAB. Good air entry to the bases with no decreased or absent breath sounds. Gastrointestinal: Bowel sounds 4 quadrants. Soft and nontender to palpation. No guarding or rigidity. No palpable masses. No distention. No CVA tenderness. Musculoskeletal: Full range of motion to all extremities. No gross deformities appreciated.  Visualization of the lower back reveals no visible signs of trauma.  Patient is nontender midline.  No significant tenderness in the bilateral paraspinal muscles but some appreciable spasms in the bilateral lumbar paraspinal muscle groups.  No extension into the SI joint or sciatic notch. Neurologic:  Normal speech and language. No gross focal neurologic deficits are appreciated.  Skin:  Skin is warm, dry and intact. No rash noted. Psychiatric: Mood and affect are normal. Speech and behavior are  normal. Patient exhibits appropriate insight and judgement.   ____________________________________________   LABS (all labs ordered are listed, but only abnormal results are displayed)  Labs Reviewed  URINALYSIS, COMPLETE (UACMP) WITH MICROSCOPIC - Abnormal; Notable for the following components:      Result Value   Color, Urine STRAW (*)    APPearance CLEAR (*)    All other components within normal limits   ____________________________________________  EKG   ____________________________________________  RADIOLOGY   No results found.  ____________________________________________    PROCEDURES  Procedure(s) performed:    Procedures    Medications  meloxicam (MOBIC) tablet 15 mg (has no administration in time range)  methocarbamol (ROBAXIN) tablet 1,000 mg (has no administration in time range)     ____________________________________________   INITIAL IMPRESSION / ASSESSMENT AND PLAN / ED COURSE  Pertinent labs & imaging results that were available during my care of the  patient were reviewed by me and considered in my medical decision making (see chart for details).  Review of the [place] CSRS was performed in accordance of the [facility] prior to dispensing any controlled drugs.           Patient's diagnosis is consistent with lumbar strain.  Patient presented to the emergency department complaining of bilateral lower back pain without trauma.  No urinary or GI complaints.  Overall exam is reassuring.  No indication for work-up at this time to include imaging or labs.  Meloxicam and Robaxin for symptom relief.  Follow-up primary care as needed. Patient is given ED precautions to return to the ED for any worsening or new symptoms.     ____________________________________________  FINAL CLINICAL IMPRESSION(S) / ED DIAGNOSES  Final diagnoses:  Strain of lumbar region, initial encounter      NEW MEDICATIONS STARTED DURING THIS VISIT:  ED Discharge Orders          Ordered    meloxicam (MOBIC) 15 MG tablet  Daily        11/29/20 2122    methocarbamol (ROBAXIN) 500 MG tablet  4 times daily        11/29/20 2122              This chart was dictated using voice recognition software/Dragon. Despite best efforts to proofread, errors can occur which can change the meaning. Any change was purely unintentional.    Racheal Patches, PA-C 11/29/20 2124    Delton Prairie, MD 11/29/20 2330

## 2020-12-15 ENCOUNTER — Emergency Department
Admission: EM | Admit: 2020-12-15 | Discharge: 2020-12-15 | Discharge status: Against Medical Advice | Payer: BLUE CROSS/BLUE SHIELD | Attending: Emergency Medicine | Admitting: Emergency Medicine

## 2020-12-15 ENCOUNTER — Encounter: Payer: Self-pay | Admitting: Emergency Medicine

## 2020-12-22 ENCOUNTER — Other Ambulatory Visit: Payer: Self-pay

## 2020-12-22 ENCOUNTER — Emergency Department
Admission: EM | Admit: 2020-12-22 | Discharge: 2020-12-22 | Disposition: A | Discharge status: Home / Self Care | Payer: BLUE CROSS/BLUE SHIELD | Attending: Emergency Medicine | Admitting: Emergency Medicine

## 2020-12-22 ENCOUNTER — Emergency Department: Payer: BLUE CROSS/BLUE SHIELD

## 2020-12-22 ENCOUNTER — Encounter: Payer: Self-pay | Admitting: Emergency Medicine

## 2020-12-22 DIAGNOSIS — J45909 Unspecified asthma, uncomplicated: Secondary | ICD-10-CM | POA: Diagnosis not present

## 2020-12-22 DIAGNOSIS — Z91010 Allergy to peanuts: Secondary | ICD-10-CM | POA: Diagnosis not present

## 2020-12-22 DIAGNOSIS — B349 Viral infection, unspecified: Secondary | ICD-10-CM | POA: Diagnosis not present

## 2020-12-22 DIAGNOSIS — R062 Wheezing: Secondary | ICD-10-CM

## 2020-12-22 DIAGNOSIS — R059 Cough, unspecified: Secondary | ICD-10-CM

## 2020-12-22 DIAGNOSIS — J029 Acute pharyngitis, unspecified: Secondary | ICD-10-CM | POA: Diagnosis present

## 2020-12-22 MED ORDER — PREDNISONE 20 MG PO TABS: 40.0000 mg | ORAL_TABLET | Freq: Every day | ORAL | 0 refills | Status: AC

## 2020-12-22 MED ORDER — DEXAMETHASONE SODIUM PHOSPHATE 10 MG/ML IJ SOLN
10.0000 mg | Freq: Once | INTRAMUSCULAR | Status: AC
  Administered 2020-12-22: 10 mg via INTRAMUSCULAR
  Filled 2020-12-22: qty 1

## 2020-12-22 MED ORDER — IPRATROPIUM-ALBUTEROL 0.5-2.5 (3) MG/3ML IN SOLN
3.0000 mL | Freq: Once | RESPIRATORY_TRACT | Status: AC
  Administered 2020-12-22: 3 mL via RESPIRATORY_TRACT
  Filled 2020-12-22: qty 3

## 2020-12-22 NOTE — ED Triage Notes (Signed)
Pt to ED via POV. Pt has been feeling sick since Monday. Pt states throat is sore and is feeling a little congested and has a non productive cough. Pt denies chest pain.

## 2020-12-22 NOTE — ED Provider Notes (Signed)
[facility] EMERGENCY DEPARTMENT Provider Note   CSN: [id number] Arrival date & time: 12/22/20  1825     History No chief complaint on file.   Bruce Woodard is a 35 y.o. male presents to the emergency department for evaluation of feeling sick since Monday.  He said sore throat congestion and dry cough.  No chest pain/shortness of breath.  Has had some wheezing.  Is been using a albuterol inhaler with little relief.  He has been on clindamycin for a dental infection.  Patient was seen earlier in the week and swabbed for strep throat, this was negative.  Patient has been taking over-the-counter medications for common cold.  He denies any fevers.  No productive cough.  No nausea vomiting abdominal pain or diarrhea.  He was diagnosed with Covid 1 month ago, he recovered well.  HPI     Past Medical History:  Diagnosis Date  . Asthma    as a child    Patient Active Problem List   Diagnosis Date Noted  . 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 Laceration of extensor tendon of hand 11/23/2015    Past Surgical History:  Procedure Laterality Date  . REPAIR EXTENSOR TENDON Right 11/14/2015   Procedure: REPAIR EXTENSOR TENDON, 3rd and 5th;  Surgeon: Christena Flake, MD;  Location: [facility] ORS;  Service: Orthopedics;  Laterality: Right;       Family History  Problem Relation Age of Onset  . Hypertension Mother   . Rheum arthritis Mother     Social History   Tobacco Use  . Smoking status: Never Smoker  . Smokeless tobacco: Never Used  Vaping Use  . Vaping Use: Never used  Substance Use Topics  . Alcohol use: Yes    Comment: socially  . Drug use: No    Home Medications Prior to Admission medications   Medication Sig Start Date End Date Taking? Authorizing Provider  predniSONE (DELTASONE) 20 MG tablet Take 2 tablets (40 mg total) by mouth daily for 5 days. 12/22/20 12/27/20 Yes Evon Slack, PA-C  albuterol (PROVENTIL HFA;VENTOLIN HFA) 108 (90 Base) MCG/ACT inhaler  Inhale 2 puffs into the lungs every 6 (six) hours as needed. 07/28/18   Myrna Blazer, MD  buPROPion (WELLBUTRIN XL) 150 MG 24 hr tablet Take 150 mg by mouth daily. 05/21/19   [provider]  cetirizine (ZYRTEC) 10 MG tablet Take 10 mg by mouth daily.    [provider]  Diphenhyd-Hydrocort-Nystatin (FIRST-DUKES MOUTHWASH MT) Swish and spit 5 mLs every 6 (six) hours as needed for Pain Duke's Magic Mouthwash (contains the following ratios): Nystatin susp 100,000 units/mL = 30 mL Hydrocortisone = 60 mg. Viscus Xylocaine= 30 ml QS with diphenhydramine HCl syrup to volume of 240 mL 12/13/19   [provider]  diphenhydrAMINE (BENADRYL) 50 MG capsule Take 50 mg by mouth every 6 (six) hours as needed for itching.    [provider]  EPINEPHrine (EPIPEN 2-PAK) 0.3 mg/0.3 mL IJ SOAJ injection Inject 0.3 mLs (0.3 mg total) into the muscle as needed for up to 1 dose for anaphylaxis. 01/30/19   Dionne Bucy, MD  fluticasone (FLONASE) 50 MCG/ACT nasal spray Place 2 sprays into both nostrils daily.    [provider]  meloxicam (MOBIC) 15 MG tablet Take 1 tablet (15 mg total) by mouth daily. 11/29/20   Cuthriell, Delorise Royals, PA-C  methocarbamol (ROBAXIN) 500 MG tablet Take 1 tablet (500 mg total) by mouth 4 (four) times daily. 11/29/20  Cuthriell, Delorise Royals, PA-C    Allergies    Peanut oil, Penicillins, Shellfish allergy, Shrimp extract allergy skin test, Amoxicillin, Grass extracts [gramineae pollens], Peanut-containing drug products, and Tomato  Review of Systems   Review of Systems  Constitutional: Negative for chills and fever.  HENT: Positive for congestion and sore throat.   Respiratory: Positive for cough and wheezing. Negative for choking, chest tightness and shortness of breath.   Gastrointestinal: Negative for abdominal pain, nausea and vomiting.  Musculoskeletal: Negative for back pain and myalgias.  Skin: Negative for wound.     Physical Exam Updated Vital Signs BP 123/77 (BP Location: Right Arm)   Pulse 70   Temp 98.2 F (36.8 C) (Oral)   Resp 16   Ht 5\' 11"  (1.803 m)   Wt 88.9 kg   SpO2 100%   BMI 27.34 kg/m   Physical Exam Constitutional:      Appearance: He is well-developed and well-nourished.  HENT:     Head: Normocephalic and atraumatic.     Comments: No facial swelling.    Nose: Congestion present.     Mouth/Throat:     Mouth: Mucous membranes are moist.     Pharynx: No oropharyngeal exudate or posterior oropharyngeal erythema.     Comments: No peritonsillar abscess. Eyes:     Extraocular Movements: EOM normal.     Conjunctiva/sclera: Conjunctivae normal.     Pupils: Pupils are equal, round, and reactive to light.  Cardiovascular:     Rate and Rhythm: Normal rate and regular rhythm.     Pulses: Normal pulses and intact distal pulses.     Heart sounds: Normal heart sounds.  Pulmonary:     Effort: Pulmonary effort is normal. No respiratory distress.     Breath sounds: No wheezing or rales.     Comments: Decreased air movement bilaterally, no wheezes. Chest:     Chest wall: No tenderness.  Abdominal:     General: Bowel sounds are normal. There is no distension.     Palpations: Abdomen is soft.     Tenderness: There is no abdominal tenderness.  Musculoskeletal:        General: No tenderness or edema. Normal range of motion.     Cervical back: Normal range of motion and neck supple.  Skin:    General: Skin is warm and dry.  Neurological:     Mental Status: He is alert and oriented to person, place, and time.  Psychiatric:        Mood and Affect: Mood and affect normal.        Behavior: Behavior normal.        Thought Content: Thought content normal.        Judgment: Judgment normal.     ED Results / Procedures / Treatments   Labs (all labs ordered are listed, but only abnormal results are displayed) Labs Reviewed - No data to display  EKG None  Radiology DG Chest 2  View  Result Date: 12/22/2020 CLINICAL DATA:  Sick since Monday; sore throat, congestion, nonproductive cough EXAM: CHEST - 2 VIEW COMPARISON:  Radiograph 09/04/2019 FINDINGS: No consolidation, features of edema, pneumothorax, or effusion. Pulmonary vascularity is normally distributed. The cardiomediastinal contours are unremarkable. No acute osseous or soft tissue abnormality. IMPRESSION: No acute cardiopulmonary abnormality. Electronically Signed   By: 09/06/2019 M.D.   On: 12/22/2020 19:40    Procedures Procedures   Medications Ordered in ED Medications  ipratropium-albuterol (DUONEB) 0.5-2.5 (3) MG/3ML nebulizer solution 3  mL (3 mLs Nebulization Given 12/22/20 1940)  dexamethasone (DECADRON) injection 10 mg (10 mg Intramuscular Given 12/22/20 1940)    ED Course  I have reviewed the triage vital signs and the nursing notes.  Pertinent labs & imaging results that were available during my care of the patient were reviewed by me and considered in my medical decision making (see chart for details).    MDM Rules/Calculators/A&P                          35 year old male with viral illness.  Has developed a little bit of wheezing, dry cough and continued congestion.  Also complains of a sore throat.  No signs of strep.  Vital signs stable, afebrile, normal pulse with 100% O2 sats.  Chest x-ray negative for any acute cardiopulmonary process.  Patient was given 1 DuoNeb treatment with significant improvement and chest tightness.  Patient was given 10 mg of dexamethasone IM.  Patient will start prednisone for 5 days.  He will continue home albuterol.  He understands signs symptoms return to the ER for. Final Clinical Impression(s) / ED Diagnoses Final diagnoses:  Cough  Wheezing  Viral illness    Rx / DC Orders ED Discharge Orders         Ordered    predniSONE (DELTASONE) 20 MG tablet  Daily        12/22/20 2017           Ronnette Juniper 12/22/20 2022    Sharman Cheek,  MD 12/22/20 2322

## 2020-12-22 NOTE — Discharge Instructions (Addendum)
Please continue with albuterol inhaler.  Take prednisone as prescribed.  Return to the ER for any worsening symptoms or urgent changes in health such as shortness of breath, chest tightness, wheezing, facial swelling, difficulty swallowing.

## 2020-12-22 NOTE — ED Notes (Signed)
Patient declined discharge vital signs. 

## 2021-03-06 ENCOUNTER — Ambulatory Visit: Payer: Self-pay | Admitting: Urology

## 2021-03-13 ENCOUNTER — Encounter: Payer: Self-pay | Admitting: Urology

## 2021-03-13 ENCOUNTER — Ambulatory Visit: Payer: Self-pay | Admitting: Urology

## 2021-03-14 ENCOUNTER — Emergency Department
Admission: EM | Admit: 2021-03-14 | Discharge: 2021-03-14 | Disposition: A | Discharge status: Home / Self Care | Payer: 59 | Attending: Emergency Medicine | Admitting: Emergency Medicine

## 2021-03-14 ENCOUNTER — Encounter: Payer: Self-pay | Admitting: Emergency Medicine

## 2021-03-14 ENCOUNTER — Other Ambulatory Visit: Payer: Self-pay

## 2021-03-14 ENCOUNTER — Ambulatory Visit (INDEPENDENT_AMBULATORY_CARE_PROVIDER_SITE_OTHER)
Admission: EM | Admit: 2021-03-14 | Discharge: 2021-03-14 | Disposition: A | Discharge status: Home / Self Care | Payer: 59 | Source: Home / Self Care | Attending: Sports Medicine | Admitting: Sports Medicine

## 2021-03-14 DIAGNOSIS — R360 Urethral discharge without blood: Secondary | ICD-10-CM

## 2021-03-14 DIAGNOSIS — N468 Other male infertility: Secondary | ICD-10-CM | POA: Insufficient documentation

## 2021-03-14 DIAGNOSIS — J45909 Unspecified asthma, uncomplicated: Secondary | ICD-10-CM | POA: Diagnosis not present

## 2021-03-14 DIAGNOSIS — A749 Chlamydial infection, unspecified: Secondary | ICD-10-CM | POA: Diagnosis present

## 2021-03-14 DIAGNOSIS — Z91010 Allergy to peanuts: Secondary | ICD-10-CM | POA: Diagnosis not present

## 2021-03-14 LAB — CHLAMYDIA/NGC RT PCR (ARMC ONLY)
Chlamydia Tr: DETECTED — AB
N gonorrhoeae: NOT DETECTED

## 2021-03-14 LAB — URINALYSIS, COMPLETE (UACMP) WITH MICROSCOPIC
Bacteria, UA: NONE SEEN
Bilirubin Urine: NEGATIVE
Glucose, UA: NEGATIVE mg/dL
Hgb urine dipstick: NEGATIVE
Ketones, ur: NEGATIVE mg/dL
Leukocytes,Ua: NEGATIVE
Nitrite: NEGATIVE
Protein, ur: NEGATIVE mg/dL
Specific Gravity, Urine: 1.025 (ref 1.005–1.030)
pH: 6 (ref 5.0–8.0)

## 2021-03-14 MED ORDER — DOXYCYCLINE HYCLATE 100 MG PO TABS
100.0000 mg | ORAL_TABLET | Freq: Once | ORAL | Status: AC
  Administered 2021-03-14: 100 mg via ORAL
  Filled 2021-03-14: qty 1

## 2021-03-14 MED ORDER — AZITHROMYCIN 500 MG PO TABS
2000.0000 mg | ORAL_TABLET | Freq: Once | ORAL | Status: AC
  Administered 2021-03-14: 2000 mg via ORAL
  Filled 2021-03-14: qty 4

## 2021-03-14 MED ORDER — DOXYCYCLINE MONOHYDRATE 100 MG PO TABS: 100.0000 mg | ORAL_TABLET | Freq: Two times a day (BID) | ORAL | 0 refills | Status: AC

## 2021-03-14 NOTE — Discharge Instructions (Addendum)
As we discussed, your urinalysis does not show that you have a urinary tract infection. Your chlamydia and gonorrhea test were pending. I provided several contacts for doctors in urology.  You can go ahead and give them a call.  See if you can schedule an appointment with one of them.  This will also address some of your infertility concerns. Provided you a work note just saying you were seen today. No medications are indicated.  If your test is positive someone will contact you and prescribe medications.

## 2021-03-14 NOTE — ED Provider Notes (Signed)
[facility]    CSN: [id number] Arrival date & time: 03/14/21  1117      History   Chief Complaint Chief Complaint  Patient presents with  . Penile Discharge    HPI Bruce Woodard is a 35 y.o. male.   Patient is a pleasant 35 year old male who presents for evaluation of the above issue.  He normally sees [facility] but cannot get appointment today.  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  He reports a whitish clear discharge from his penis that has been present for about a week.  He denies any dysuria, increased urgency, increased frequency, incomplete voiding, hematuria, or any other urinary symptoms.  He is sexually active with 1 partner, his wife.  He denies any testicular pain or scrotal pain.  I did a thorough review of his medical record and it appears as though he had been referred to urology in the past for concerns about testicular pain which she does not have today.  He was diagnosed with an epididymal cyst.  Urology just wanted to monitor that.  He was also concerned about some infertility issues.  He was supposed to follow-up with them but he says due to COVID that has not happened.  He said that he attempted to go there yesterday but was told that he missed his appointment time and that he had to find another urologist because he had missed too many appointments.  He denies any abdominal pain, nausea vomiting diarrhea, fever shakes chills, or any other red flag signs or symptoms.     Past Medical History:  Diagnosis Date  . Asthma    as a child    Patient Active Problem List   Diagnosis Date Noted  . 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 Laceration of extensor tendon of hand 11/23/2015    Past Surgical History:  Procedure Laterality Date  . REPAIR EXTENSOR TENDON Right 11/14/2015   Procedure: REPAIR EXTENSOR TENDON, 3rd and 5th;  Surgeon: Christena Flake, MD;  Location: [facility] ORS;  Service: Orthopedics;  Laterality: Right;        Home Medications    Prior to Admission medications   Medication Sig Start Date End Date Taking? Authorizing Provider  albuterol (PROVENTIL HFA;VENTOLIN HFA) 108 (90 Base) MCG/ACT inhaler Inhale 2 puffs into the lungs every 6 (six) hours as needed. 07/28/18  Yes Myrna Blazer, MD  cetirizine (ZYRTEC) 10 MG tablet Take 10 mg by mouth daily.   Yes [provider]  buPROPion (WELLBUTRIN XL) 150 MG 24 hr tablet Take 150 mg by mouth daily. 05/21/19   [provider]  Diphenhyd-Hydrocort-Nystatin (FIRST-DUKES MOUTHWASH MT) Swish and spit 5 mLs every 6 (six) hours as needed for Pain [facility] Magic Mouthwash (contains the following ratios): Nystatin susp 100,000 units/mL = 30 mL Hydrocortisone = 60 mg. Viscus Xylocaine= 30 ml QS with diphenhydramine HCl syrup to volume of 240 mL 12/13/19   [provider]  diphenhydrAMINE (BENADRYL) 50 MG capsule Take 50 mg by mouth every 6 (six) hours as needed for itching.    [provider]  EPINEPHrine (EPIPEN 2-PAK) 0.3 mg/0.3 mL IJ SOAJ injection Inject 0.3 mLs (0.3 mg total) into the muscle as needed for up to 1 dose for anaphylaxis. 01/30/19   Dionne Bucy, MD  fluticasone (FLONASE) 50 MCG/ACT nasal spray Place 2 sprays into both nostrils daily.    [provider]  meloxicam (MOBIC) 15 MG tablet Take 1 tablet (15 mg  total) by mouth daily. 11/29/20   Cuthriell, Delorise Royals, PA-C  methocarbamol (ROBAXIN) 500 MG tablet Take 1 tablet (500 mg total) by mouth 4 (four) times daily. 11/29/20   Cuthriell, Delorise Royals, PA-C    Family History Family History  Problem Relation Age of Onset  . Hypertension Mother   . Rheum arthritis Mother     Social History Social History   Tobacco Use  . Smoking status: Never Smoker  . Smokeless tobacco: Never Used  Vaping Use  . Vaping Use: Never used  Substance Use Topics  . Alcohol use: Yes    Comment: socially  . Drug use: No     Allergies   Peanut oil,  Penicillins, Shellfish allergy, Shrimp extract allergy skin test, Amoxicillin, Grass extracts [gramineae pollens], Peanut-containing drug products, and Tomato   Review of Systems Review of Systems  Constitutional: Negative for appetite change, chills, diaphoresis, fatigue and fever.  HENT: Negative for congestion, ear pain, postnasal drip, rhinorrhea, sinus pressure, sinus pain, sneezing and sore throat.   Eyes: Negative for pain.  Respiratory: Negative for cough, chest tightness and shortness of breath.   Cardiovascular: Negative for chest pain and palpitations.  Gastrointestinal: Negative for abdominal pain, diarrhea, nausea and vomiting.  Genitourinary: Positive for penile discharge. Negative for dysuria, flank pain, frequency, genital sores, hematuria, penile pain, penile swelling, scrotal swelling, testicular pain and urgency.  Musculoskeletal: Negative for back pain, myalgias and neck pain.  Skin: Negative for color change, pallor, rash and wound.  Neurological: Negative for dizziness, light-headedness and headaches.  All other systems reviewed and are negative.    Physical Exam Triage Vital Signs ED Triage Vitals  Enc Vitals Group     BP 03/14/21 1206 119/89     Pulse Rate 03/14/21 1206 72     Resp 03/14/21 1206 18     Temp 03/14/21 1206 98 F (36.7 C)     Temp Source 03/14/21 1206 Oral     SpO2 03/14/21 1206 100 %     Weight 03/14/21 1204 195 lb 15.8 oz (88.9 kg)     Height 03/14/21 1204 5\' 11"  (1.803 m)     Head Circumference --      Peak Flow --      Pain Score 03/14/21 1204 0     Pain Loc --      Pain Edu? --      Excl. in GC? --    No data found.  Updated Vital Signs BP 119/89 (BP Location: Right Arm)   Pulse 72   Temp 98 F (36.7 C) (Oral)   Resp 18   Ht 5\' 11"  (1.803 m)   Wt 88.9 kg   SpO2 100%   BMI 27.33 kg/m   Visual Acuity Right Eye Distance:   Left Eye Distance:   Bilateral Distance:    Right Eye Near:   Left Eye Near:    Bilateral  Near:     Physical Exam Vitals and nursing note reviewed.  Constitutional:      Appearance: Normal appearance.  HENT:     Head: Normocephalic and atraumatic.     Nose: Nose normal.     Mouth/Throat:     Mouth: Mucous membranes are moist.  Eyes:     Conjunctiva/sclera: Conjunctivae normal.     Pupils: Pupils are equal, round, and reactive to light.  Cardiovascular:     Rate and Rhythm: Normal rate and regular rhythm.     Pulses: Normal pulses.  Heart sounds: Normal heart sounds. No murmur heard. No friction rub. No gallop.   Pulmonary:     Effort: Pulmonary effort is normal.     Breath sounds: Normal breath sounds. No stridor. No wheezing, rhonchi or rales.  Abdominal:     General: Abdomen is flat. There is no distension.     Palpations: Abdomen is soft.     Tenderness: There is no abdominal tenderness. There is no right CVA tenderness, left CVA tenderness, guarding or rebound.     Hernia: No hernia is present. There is no hernia in the left inguinal area or right inguinal area.  Genitourinary:    Pubic Area: No rash.      Penis: Circumcised. Tenderness present.      Testes: Normal.        Right: Mass, tenderness, swelling, testicular hydrocele or varicocele not present.        Left: Mass, tenderness, swelling, testicular hydrocele or varicocele not present.     Tanner stage (genital): 5.     Comments: Patient has some mild tenderness at the tip of the penis.  This may be from irritation as he admits that he is trying to milk some of the discharge.  No obvious discharge appreciated on my examination. Musculoskeletal:     Cervical back: Normal range of motion and neck supple.  Lymphadenopathy:     Lower Body: No right inguinal adenopathy. No left inguinal adenopathy.  Skin:    General: Skin is warm and dry.     Capillary Refill: Capillary refill takes less than 2 seconds.  Neurological:     General: No focal deficit present.     Mental Status: He is alert and oriented to  person, place, and time.      UC Treatments / Results  Labs (all labs ordered are listed, but only abnormal results are displayed) Labs Reviewed  CHLAMYDIA/NGC RT PCR ([facility] ONLY)  URINALYSIS, COMPLETE ([facility]MP) WITH MICROSCOPIC    EKG   Radiology No results found.  Procedures Procedures (including critical care time)  Medications Ordered in UC Medications - No data to display  Initial Impression / Assessment and Plan / UC Course  I have reviewed the triage vital signs and the nursing notes.  Pertinent labs & imaging results that were available during my care of the patient were reviewed by me and considered in my medical decision making (see chart for details).  Clinical impression: 1.  Penile discharge for 1 week.  Physical exam is reassuring. 2.  Recommended getting a UA.  It did not show UTI. 3.  Also tested him for chlamydia and gonorrhea that was pending at the time of discharge.  Someone will contact him if he is positive and initiate treatment. 4.  Educational handouts provided. 5.  I went ahead and gave him the names of 3 urologist locally and see if he can get an appointment.  This would be for his discharge but also the fact that he has a history of some infertility and wants to follow-up with somebody to further address that. 6.  I gave him a work note saying he was seen today. 7.  No medicines indicated at this time. 8.  Discharged from care in stable condition and follow-up here as needed.    Final Clinical Impressions(s) / UC Diagnoses   Final diagnoses:  Penile discharge, without blood     Discharge Instructions     As we discussed, your urinalysis does not show that you have  a urinary tract infection. Your chlamydia and gonorrhea test were pending. I provided several contacts for doctors in urology.  You can go ahead and give them a call.  See if you can schedule an appointment with one of them.  This will also address some of your  infertility concerns. Provided you a work note just saying you were seen today. No medications are indicated.  If your test is positive someone will contact you and prescribe medications.    ED Prescriptions    None     PDMP not reviewed this encounter.   Delton See, MD 03/14/21 [phone]

## 2021-03-14 NOTE — Discharge Instructions (Addendum)
Please use the antibiotics as prescribed for the next 7 days.  Please avoid any sexual contact until completion of course of antibiotics.  Follow-up with primary care if any additional concerns.

## 2021-03-14 NOTE — ED Provider Notes (Signed)
[facility] Emergency Department Provider Note  ____________________________________________   Event Date/Time   First MD Initiated Contact with Patient 03/14/21 2124     (approximate)  I have reviewed the triage vital signs and the nursing notes.   HISTORY  Chief Complaint SEXUALLY TRANSMITTED DISEASE   HPI Bruce Woodard is a 35 y.o. male who presents to the emergency department for evaluation and treatment of known STD.  Patient states that he was seen at the [facility] urgent care earlier today for evaluation of urethral discharge and was tested for STD.  The patient's test came back later after he was gone from the facility as being positive for chlamydia.  He reports that he is here for treatment as he did not receive any in the urgent care.  He denied any fevers, abdominal pain, scrotal pain.        Past Medical History:  Diagnosis Date  . Asthma    as a child    Patient Active Problem List   Diagnosis Date Noted  . 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 Laceration of extensor tendon of hand 11/23/2015    Past Surgical History:  Procedure Laterality Date  . REPAIR EXTENSOR TENDON Right 11/14/2015   Procedure: REPAIR EXTENSOR TENDON, 3rd and 5th;  Surgeon: Christena Flake, MD;  Location: [facility] ORS;  Service: Orthopedics;  Laterality: Right;    Prior to Admission medications   Medication Sig Start Date End Date Taking? Authorizing Provider  doxycycline (ADOXA) 100 MG tablet Take 1 tablet (100 mg total) by mouth 2 (two) times daily for 7 days. 03/14/21 03/21/21 Yes Jerelene Salaam, Ruben Gottron, PA  albuterol (PROVENTIL HFA;VENTOLIN HFA) 108 (90 Base) MCG/ACT inhaler Inhale 2 puffs into the lungs every 6 (six) hours as needed. 07/28/18   Myrna Blazer, MD  buPROPion (WELLBUTRIN XL) 150 MG 24 hr tablet Take 150 mg by mouth daily. 05/21/19   [provider]  cetirizine (ZYRTEC) 10 MG tablet Take 10 mg by mouth daily.    [provider]   Diphenhyd-Hydrocort-Nystatin (FIRST-DUKES MOUTHWASH MT) Swish and spit 5 mLs every 6 (six) hours as needed for Pain [facility] Magic Mouthwash (contains the following ratios): Nystatin susp 100,000 units/mL = 30 mL Hydrocortisone = 60 mg. Viscus Xylocaine= 30 ml QS with diphenhydramine HCl syrup to volume of 240 mL 12/13/19   [provider]  diphenhydrAMINE (BENADRYL) 50 MG capsule Take 50 mg by mouth every 6 (six) hours as needed for itching.    [provider]  EPINEPHrine (EPIPEN 2-PAK) 0.3 mg/0.3 mL IJ SOAJ injection Inject 0.3 mLs (0.3 mg total) into the muscle as needed for up to 1 dose for anaphylaxis. 01/30/19   Dionne Bucy, MD  fluticasone (FLONASE) 50 MCG/ACT nasal spray Place 2 sprays into both nostrils daily.    [provider]  meloxicam (MOBIC) 15 MG tablet Take 1 tablet (15 mg total) by mouth daily. 11/29/20   Cuthriell, Delorise Royals, PA-C  methocarbamol (ROBAXIN) 500 MG tablet Take 1 tablet (500 mg total) by mouth 4 (four) times daily. 11/29/20   Cuthriell, Delorise Royals, PA-C    Allergies Peanut oil, Penicillins, Shellfish allergy, Shrimp extract allergy skin test, Amoxicillin, Grass extracts [gramineae pollens], Peanut-containing drug products, and Tomato  Family History  Problem Relation Age of Onset  . Hypertension Mother   . Rheum arthritis Mother     Social History Social History   Tobacco Use  . Smoking status: Never Smoker  . Smokeless tobacco: Never Used  Vaping Use  . Vaping Use: Never used  Substance Use Topics  . Alcohol use: Yes    Comment: socially  . Drug use: No    Review of Systems Constitutional: No fever/chills Eyes: No visual changes. ENT: No sore throat. Cardiovascular: Denies chest pain. Respiratory: Denies shortness of breath. Gastrointestinal: No abdominal pain.  No nausea, no vomiting.  No diarrhea.  No constipation. Genitourinary: + Penile discharge, negative for dysuria. Musculoskeletal: Negative for back  pain. Skin: Negative for rash. Neurological: Negative for headaches, focal weakness or numbness.   ____________________________________________   PHYSICAL EXAM:  VITAL SIGNS: ED Triage Vitals  Enc Vitals Group     BP 03/14/21 2158 123/86     Pulse Rate 03/14/21 2158 73     Resp 03/14/21 2158 17     Temp 03/14/21 2158 98.2 F (36.8 C)     Temp Source 03/14/21 2158 Oral     SpO2 03/14/21 2158 97 %     Weight 03/14/21 2027 198 lb (89.8 kg)     Height 03/14/21 2027 5\' 11"  (1.803 m)     Head Circumference --      Peak Flow --      Pain Score 03/14/21 2026 0     Pain Loc --      Pain Edu? --      Excl. in GC? --     Constitutional: Alert and oriented. Well appearing and in no acute distress. Eyes: Conjunctivae are normal. PERRL. EOMI. Head: Atraumatic. Nose: No congestion/rhinnorhea. Mouth/Throat: Mucous membranes are moist.  Oropharynx non-erythematous. Neck: No stridor.   Cardiovascular: Normal rate, regular rhythm.  Respiratory: Normal respiratory effort.  No retractions. Lungs CTAB. Gastrointestinal: Soft and nontender.  Musculoskeletal: No lower extremity tenderness nor edema.  No joint effusions. Neurologic:  Normal speech and language. No gross focal neurologic deficits are appreciated.  Skin:  Skin is warm, dry and intact. No rash noted. Psychiatric: Mood and affect are normal. Speech and behavior are normal.  ____________________________________________   INITIAL IMPRESSION / ASSESSMENT AND PLAN / ED COURSE  As part of my medical decision making, I reviewed the following data within the electronic MEDICAL RECORD NUMBER Nursing notes reviewed and incorporated and Notes from prior ED visits        Patient is a 35 year old male who presents to the emergency department for treatment after recent positive chlamydia test.  Labs reviewed after they were obtained from [facility] urgent care earlier today and he was positive for chlamydia, gonorrhea was not detected.   Recommended concomitant treatment for both chlamydia and gonorrhea given their pattern in the community.  Patient was given 7-day course of doxycycline and given severe anaphylactic reaction to penicillins in the past, deferred use of IM Rocephin and instead used 2 g single dose of azithromycin.  Risk for incomplete treatment with the azithromycin was discussed with the patient, and recommended follow-up with PCP, however given the patient's gonorrhea test was not detected, reassured this will be less clinically significant.  Return precautions were discussed, patient stable time for outpatient management.      ____________________________________________   FINAL CLINICAL IMPRESSION(S) / ED DIAGNOSES  Final diagnoses:  Chlamydia     ED Discharge Orders         Ordered    doxycycline (ADOXA) 100 MG tablet  2 times daily        03/14/21 2230          *Please note:  Bruce Woodard was evaluated in Emergency  Department on 03/14/2021 for the symptoms described in the history of present illness. He was evaluated in the context of the global COVID-19 pandemic, which necessitated consideration that the patient might be at risk for infection with the SARS-CoV-2 virus that causes COVID-19. Institutional protocols and algorithms that pertain to the evaluation of patients at risk for COVID-19 are in a state of rapid change based on information released by regulatory bodies including the CDC and federal and state organizations. These policies and algorithms were followed during the patient's care in the ED.  Some ED evaluations and interventions may be delayed as a result of limited staffing during and the pandemic.*   Note:  This document was prepared using Dragon voice recognition software and may include unintentional dictation errors.   Lucy Chris, PA 03/14/21 2350    Merwyn Katos, MD 03/15/21 [phone]

## 2021-03-14 NOTE — ED Triage Notes (Signed)
Patient c/o penile discharge that started last week. Denies any other symptoms.

## 2021-03-14 NOTE — ED Triage Notes (Signed)
Pt to ED from home states went to [facility] UC today with penile discharge, denies other complaints.  Had urine test done there came back positive for chlamydia but states was not given medicines and wants to be treated.

## 2021-03-20 ENCOUNTER — Ambulatory Visit: Payer: 59 | Admitting: Urology

## 2021-05-20 ENCOUNTER — Emergency Department: Payer: Self-pay

## 2021-05-20 ENCOUNTER — Emergency Department
Admission: EM | Admit: 2021-05-20 | Discharge: 2021-05-21 | Disposition: A | Discharge status: Home / Self Care | Payer: Self-pay | Attending: Emergency Medicine | Admitting: Emergency Medicine

## 2021-05-20 ENCOUNTER — Other Ambulatory Visit: Payer: Self-pay

## 2021-05-20 ENCOUNTER — Encounter: Payer: Self-pay | Admitting: *Deleted

## 2021-05-20 DIAGNOSIS — R0981 Nasal congestion: Secondary | ICD-10-CM | POA: Insufficient documentation

## 2021-05-20 DIAGNOSIS — R519 Headache, unspecified: Secondary | ICD-10-CM | POA: Insufficient documentation

## 2021-05-20 DIAGNOSIS — J45909 Unspecified asthma, uncomplicated: Secondary | ICD-10-CM | POA: Insufficient documentation

## 2021-05-20 DIAGNOSIS — R5383 Other fatigue: Secondary | ICD-10-CM | POA: Insufficient documentation

## 2021-05-20 DIAGNOSIS — Z91010 Allergy to peanuts: Secondary | ICD-10-CM | POA: Insufficient documentation

## 2021-05-20 DIAGNOSIS — Z20822 Contact with and (suspected) exposure to covid-19: Secondary | ICD-10-CM | POA: Insufficient documentation

## 2021-05-20 DIAGNOSIS — H5789 Other specified disorders of eye and adnexa: Secondary | ICD-10-CM | POA: Insufficient documentation

## 2021-05-20 LAB — CBC WITH DIFFERENTIAL/PLATELET
Abs Immature Granulocytes: 0.02 10*3/uL (ref 0.00–0.07)
Basophils Absolute: 0.1 10*3/uL (ref 0.0–0.1)
Basophils Relative: 1 %
Eosinophils Absolute: 0.2 10*3/uL (ref 0.0–0.5)
Eosinophils Relative: 2 %
HCT: 42.7 % (ref 39.0–52.0)
Hemoglobin: 15.1 g/dL (ref 13.0–17.0)
Immature Granulocytes: 0 %
Lymphocytes Relative: 35 %
Lymphs Abs: 2.3 10*3/uL (ref 0.7–4.0)
MCH: 32.2 pg (ref 26.0–34.0)
MCHC: 35.4 g/dL (ref 30.0–36.0)
MCV: 91 fL (ref 80.0–100.0)
Monocytes Absolute: 0.6 10*3/uL (ref 0.1–1.0)
Monocytes Relative: 10 %
Neutro Abs: 3.3 10*3/uL (ref 1.7–7.7)
Neutrophils Relative %: 52 %
Platelets: 276 10*3/uL (ref 150–400)
RBC: 4.69 MIL/uL (ref 4.22–5.81)
RDW: 12.3 % (ref 11.5–15.5)
WBC: 6.5 10*3/uL (ref 4.0–10.5)
nRBC: 0 % (ref 0.0–0.2)

## 2021-05-20 MED ORDER — KETOROLAC TROMETHAMINE 30 MG/ML IJ SOLN
15.0000 mg | Freq: Once | INTRAMUSCULAR | Status: AC
  Administered 2021-05-20: 15 mg via INTRAVENOUS
  Filled 2021-05-20: qty 1

## 2021-05-20 MED ORDER — SODIUM CHLORIDE 0.9 % IV BOLUS
1000.0000 mL | Freq: Once | INTRAVENOUS | Status: AC
  Administered 2021-05-20: 1000 mL via INTRAVENOUS

## 2021-05-20 NOTE — ED Triage Notes (Signed)
Headache that comes and goes for about 5 days, makes him feel tired. Felt "off balance" a couple of days ago. Took tylenol with some relief, then it returned.

## 2021-05-20 NOTE — ED Provider Notes (Signed)
[facility] Emergency Department Provider Note   ____________________________________________   Event Date/Time   First MD Initiated Contact with Patient 05/20/21 2307     (approximate)  I have reviewed the triage vital signs and the nursing notes.   HISTORY  Chief Complaint Headache    HPI Bruce Woodard is a 35 y.o. male who presents to the ED from home with a chief complaint of headache.  Patient reports frontal headache that comes and goes for the past 5 days.  Has also felt fatigued.  Felt "off balance" a couple of days ago.  Headache relieved by Tylenol but returns.  Also notes nasal congestion and bilateral eye redness. Patient has been making lifestyle modifications recently by cutting back fatty foods and increasing exercise.  Headache feels worse in the heat.  Denies associated fever, vision changes, neck pain, cough, chest pain, shortness of breath, abdominal pain, nausea, vomiting or dizziness.      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 Authorizing Provider  butalbital-acetaminophen-caffeine (FIORICET) 50-325-40 MG tablet Take 1 tablet by mouth every 6 (six) hours as needed for headache. 05/21/21  Yes Irean Hong, MD  albuterol (PROVENTIL HFA;VENTOLIN HFA) 108 (90 Base) MCG/ACT inhaler Inhale 2 puffs into the lungs every 6 (six) hours as needed. 07/28/18   Myrna Blazer, MD  buPROPion (WELLBUTRIN XL) 150 MG 24 hr tablet Take 150 mg by mouth daily. 05/21/19    [provider]  cetirizine (ZYRTEC) 10 MG tablet Take 10 mg by mouth daily.    [provider]  Diphenhyd-Hydrocort-Nystatin (FIRST-DUKES MOUTHWASH MT) Swish and spit 5 mLs every 6 (six) hours as needed for Pain Duke's Magic Mouthwash (contains the following ratios): Nystatin susp 100,000 units/mL = 30 mL Hydrocortisone = 60 mg. Viscus Xylocaine= 30 ml QS with diphenhydramine HCl syrup to volume of 240 mL 12/13/19   [provider]  diphenhydrAMINE (BENADRYL) 50 MG capsule Take 50 mg by mouth every 6 (six) hours as needed for itching.    [provider]  EPINEPHrine (EPIPEN 2-PAK) 0.3 mg/0.3 mL IJ SOAJ injection Inject 0.3 mLs (0.3 mg total) into the muscle as needed for up to 1 dose for anaphylaxis. 01/30/19   Dionne Bucy, MD  fluticasone (FLONASE) 50 MCG/ACT nasal spray Place 2 sprays into both nostrils daily.    [provider]  meloxicam (MOBIC) 15 MG tablet Take 1 tablet (15 mg total) by mouth daily. 11/29/20   Cuthriell, Delorise Royals, PA-C  methocarbamol (ROBAXIN) 500 MG tablet Take 1 tablet (500 mg total) by mouth 4 (four) times daily. 11/29/20   Cuthriell, Delorise Royals, PA-C    Allergies Peanut oil, Penicillins, Shellfish allergy, Shrimp extract allergy skin test, Amoxicillin, Grass extracts [gramineae pollens], Peanut-containing drug products, and Tomato  Family History  Problem Relation Age of Onset   Hypertension Mother    Rheum arthritis Mother     Social History Social History   Tobacco Use  Smoking status: Never   Smokeless tobacco: Never  Vaping Use   Vaping Use: Never used  Substance Use Topics   Alcohol use: Yes    Comment: socially   Drug use: No    Review of Systems  Constitutional: No fever/chills Eyes: No visual changes. ENT: No sore throat. Cardiovascular: Denies chest pain. Respiratory: Denies shortness of breath. Gastrointestinal: No abdominal pain.  No nausea, no vomiting.  No diarrhea.  No  constipation. Genitourinary: Negative for dysuria. Musculoskeletal: Negative for back pain. Skin: Negative for rash. Neurological: Positive for headache. Negative for focal weakness or numbness.   ____________________________________________   PHYSICAL EXAM:  VITAL SIGNS: ED Triage Vitals  Enc Vitals Group     BP 05/20/21 2226 129/89     Pulse Rate 05/20/21 2226 77     Resp 05/20/21 2226 17     Temp 05/20/21 2226 98.3 F (36.8 C)     Temp Source 05/20/21 2226 Oral     SpO2 05/20/21 2226 100 %     Weight 05/20/21 2225 206 lb (93.4 kg)     Height 05/20/21 2225 5\' 11"  (1.803 m)     Head Circumference --      Peak Flow --      Pain Score 05/20/21 2230 10     Pain Loc --      Pain Edu? --      Excl. in [place]? --     Constitutional: Alert and oriented. Well appearing and in no acute distress. Eyes: Conjunctivae are normal. PERRL. EOMI. Head: Atraumatic. Ears: Unremarkable. Nose: No congestion/rhinnorhea. Mouth/Throat: Mucous membranes are moist.   Neck: No stridor.  Supple neck without meningismus. Cardiovascular: Normal rate, regular rhythm. Grossly normal heart sounds.  Good peripheral circulation. Respiratory: Normal respiratory effort.  No retractions. Lungs CTAB. Gastrointestinal: Soft and nontender. No distention. No abdominal bruits. No CVA tenderness. Musculoskeletal: No lower extremity tenderness nor edema.  No joint effusions. Neurologic: Alert and oriented x3.  CN II to XII grossly intact.  Normal speech and language. No gross focal neurologic deficits are appreciated. No gait instability. Skin:  Skin is warm, dry and intact. No rash noted.  No petechiae. Psychiatric: Mood and affect are normal. Speech and behavior are normal.  ____________________________________________   LABS (all labs ordered are listed, but only abnormal results are displayed)  Labs Reviewed  RESP PANEL BY RT-PCR (FLU A&B, COVID) ARPGX2  CBC WITH DIFFERENTIAL/PLATELET  COMPREHENSIVE  METABOLIC PANEL  CK   ____________________________________________  EKG  None ____________________________________________  RADIOLOGY I, Dawood Spitler J, personally viewed and evaluated these images (plain radiographs) as part of my medical decision making, as well as reviewing the written report by the radiologist.  ED MD interpretation: No ICH  Official radiology report(s): CT Head Wo Contrast  Result Date: 05/21/2021 CLINICAL DATA:  Headache and fatigue. Headache, tension-type headache, red eyes, fatigue EXAM: CT HEAD WITHOUT CONTRAST TECHNIQUE: Contiguous axial images were obtained from the base of the skull through the vertex without intravenous contrast. COMPARISON:  Head CT 08/31/2020 FINDINGS: Brain: No intracranial hemorrhage, mass effect, or midline shift. No hydrocephalus. The basilar cisterns are patent. No evidence of territorial infarct or acute ischemia. No extra-axial or intracranial fluid collection. Vascular: No hyperdense vessel or unexpected calcification. Skull: Normal. Negative for fracture or focal lesion. Sinuses/Orbits: Paranasal sinuses and mastoid air cells are clear. The visualized orbits are unremarkable. Other: None. IMPRESSION: Negative noncontrast head CT. Electronically Signed   By: 13/08/2020 M.D.   On: 05/21/2021 00:08  ____________________________________________   PROCEDURES  Procedure(s) performed (including Critical Care):  Procedures   ____________________________________________   INITIAL IMPRESSION / ASSESSMENT AND PLAN / ED COURSE  As part of my medical decision making, I reviewed the following data within the electronic MEDICAL RECORD NUMBER History obtained from family, Nursing notes reviewed and incorporated, Labs reviewed, Old chart reviewed, Radiograph reviewed, and Notes from prior ED visits     35 year old male presenting with frontal headache for 5 days. Differential diagnosis includes, but is not limited to, intracranial  hemorrhage, meningitis/encephalitis, previous head trauma, cavernous venous thrombosis, tension headache, temporal arteritis, migraine or migraine equivalent, idiopathic intracranial hypertension, and non-specific headache.   Given patient's recent dietary and lifestyle modifications, likely element of dehydration contributing to headache.  No focal neurological deficits on examination.  Will obtain basic lab work, CT imaging as patient has not had headaches previously.  Initiate IV fluid resuscitation, IV Toradol and reassess.  Clinical Course as of 05/21/21 0203  Mon May 21, 2021  1191 Patient feeling better.  Updated patient and family member of all test results.  Will discharge home with prescription for Fioricet to use as needed.  Strict return precautions given.  Both verbalized understanding agree with plan of care. [JS]    Clinical Course User Index [JS] Irean Hong, MD     ____________________________________________   FINAL CLINICAL IMPRESSION(S) / ED DIAGNOSES  Final diagnoses:  Acute nonintractable headache, unspecified headache type     ED Discharge Orders          Ordered    butalbital-acetaminophen-caffeine (FIORICET) 50-325-40 MG tablet  Every 6 hours PRN        05/21/21 0049             Note:  This document was prepared using Dragon voice recognition software and may include unintentional dictation errors.    Irean Hong, MD 05/21/21 [phone]

## 2021-05-21 LAB — COMPREHENSIVE METABOLIC PANEL
ALT: 43 U/L (ref 0–44)
AST: 27 U/L (ref 15–41)
Albumin: 4.1 g/dL (ref 3.5–5.0)
Alkaline Phosphatase: 60 U/L (ref 38–126)
Anion gap: 6 (ref 5–15)
BUN: 15 mg/dL (ref 6–20)
CO2: 27 mmol/L (ref 22–32)
Calcium: 9.3 mg/dL (ref 8.9–10.3)
Chloride: 103 mmol/L (ref 98–111)
Creatinine, Ser: 1.1 mg/dL (ref 0.61–1.24)
GFR, Estimated: >60 mL/min (ref >60)
Glucose, Bld: 97 mg/dL (ref 70–99)
Potassium: 3.8 mmol/L (ref 3.5–5.1)
Sodium: 136 mmol/L (ref 135–145)
Total Bilirubin: 0.5 mg/dL (ref 0.3–1.2)
Total Protein: 7.1 g/dL (ref 6.5–8.1)

## 2021-05-21 LAB — RESP PANEL BY RT-PCR (FLU A&B, COVID) ARPGX2
Influenza A by PCR: NEGATIVE
Influenza B by PCR: NEGATIVE
SARS Coronavirus 2 by RT PCR: NEGATIVE

## 2021-05-21 LAB — CK: Total CK: 145 U/L (ref 49–397)

## 2021-05-21 MED ORDER — BUTALBITAL-APAP-CAFFEINE 50-325-40 MG PO TABS: 1.0000 | ORAL_TABLET | Freq: Four times a day (QID) | ORAL | 0 refills | Status: DC | PRN

## 2021-05-21 NOTE — ED Notes (Signed)
Pt A&Ox4 ambulatory at d/c with independent steady gait, NAD. Pt verbalized understanding of d/c instructions and follow up care. °

## 2021-05-21 NOTE — Discharge Instructions (Addendum)
1.  You may take Fioricet as needed for headache. 2.  Drink plenty of fluids daily. 3.  Return to the ER for worsening symptoms, persistent vomiting, lethargy or other concerns.

## 2021-08-07 ENCOUNTER — Emergency Department: Payer: Self-pay

## 2021-08-07 ENCOUNTER — Emergency Department
Admission: EM | Admit: 2021-08-07 | Discharge: 2021-08-07 | Disposition: A | Discharge status: Home / Self Care | Payer: Self-pay | Attending: Emergency Medicine | Admitting: Emergency Medicine

## 2021-08-07 ENCOUNTER — Encounter: Payer: Self-pay | Admitting: Emergency Medicine

## 2021-08-07 ENCOUNTER — Other Ambulatory Visit: Payer: Self-pay

## 2021-08-07 DIAGNOSIS — N50812 Left testicular pain: Secondary | ICD-10-CM | POA: Insufficient documentation

## 2021-08-07 DIAGNOSIS — R52 Pain, unspecified: Secondary | ICD-10-CM

## 2021-08-07 DIAGNOSIS — Z5321 Procedure and treatment not carried out due to patient leaving prior to being seen by health care provider: Secondary | ICD-10-CM | POA: Insufficient documentation

## 2021-08-07 LAB — URINALYSIS, ROUTINE W REFLEX MICROSCOPIC
Bilirubin Urine: NEGATIVE
Glucose, UA: NEGATIVE mg/dL
Hgb urine dipstick: NEGATIVE
Ketones, ur: NEGATIVE mg/dL
Leukocytes,Ua: NEGATIVE
Nitrite: NEGATIVE
Protein, ur: NEGATIVE mg/dL
Specific Gravity, Urine: 1.013 (ref 1.005–1.030)
pH: 6 (ref 5.0–8.0)

## 2021-08-07 LAB — CHLAMYDIA/NGC RT PCR (ARMC ONLY)
Chlamydia Tr: NOT DETECTED
N gonorrhoeae: NOT DETECTED

## 2021-08-07 NOTE — ED Triage Notes (Signed)
Patient ambulatory to triage with steady gait, without difficulty or distress noted; pt reports left testicular pain, no swelling; denies urinary c/o, denies any penile discharge; st hx of same with epidymitis

## 2021-11-07 ENCOUNTER — Other Ambulatory Visit: Payer: Self-pay

## 2021-11-07 ENCOUNTER — Ambulatory Visit
Admission: EM | Admit: 2021-11-07 | Discharge: 2021-11-07 | Disposition: A | Discharge status: Home / Self Care | Payer: BC Managed Care – PPO

## 2021-11-07 DIAGNOSIS — R21 Rash and other nonspecific skin eruption: Secondary | ICD-10-CM

## 2021-11-07 DIAGNOSIS — R300 Dysuria: Secondary | ICD-10-CM | POA: Diagnosis not present

## 2021-11-07 MED ORDER — TRIAMCINOLONE ACETONIDE 0.1 % EX CREA: TOPICAL_CREAM | CUTANEOUS | 0 refills | Status: DC

## 2021-11-07 NOTE — Discharge Instructions (Addendum)
Recommend start Triamcinolone cream- apply once to twice a day to affected areas as needed- use sparingly. Continue to push fluids. Use mild soap and avoid any harsh soaps or body washes. Avoid sexual intercourse for the next 5 days to minimize any irritation. Recommend stop Doxycycline for now. Follow-up with the Urologist as planned on 11/12/2021.

## 2021-11-07 NOTE — ED Provider Notes (Signed)
[facility]    CSN: [id number] Arrival date & time: 11/07/21  1545      History   Chief Complaint Chief Complaint  Patient presents with   STI Testing    HPI Bruce Woodard is a 36 y.o. male.   36 year old male presents with pain at the end of urination for the past 5 days. Had sexual intercourse with his wife 5 days ago and shortly afterwards starting experiencing some dysuria. Denies any fever, penile discharge, abdominal or back pain. Went to his PCP 2 days ago for evaluation and UA was negative. Was placed on Pyridium and told to follow-up with Urologist in which he has an appointment in another 5 days (Jan 23rd). He called back to his PCP yesterday with continued urinary discomfort and requested antibiotics and was placed on Doxycycline. He also decided to go to [facility] ER yesterday with continued penile pain during urination. They repeated his UA which was negative and did a GC/Chlamydia test which was also negative. He was dx with contact irritation/dermatitis and told to avoid soaps and harsh detergents and follow-up with Urology as planned. He did admit to using a harsh [place] soap and other irritating soaps in the past week. Today, he developed a few red bumps on his penis, not painful and requests further evaluation. Wife also here to be seen and tested since she has developed some itching and vaginal irritation. He denies any fever, headache, abdominal pain, vomiting, diarrhea or penile discharge. He has taken 3 doses of Doxycycline and no change in symptoms. He does have a history of multiple allergies to foods and medications. Takes Benadryl often before eating certain foods. Did have an Epi-Pen but has expired. History of asthma as a child- no current Albuterol or inhaler use. History of positive Chlamydia in May 2022. No other chronic health issues.   The history is provided by the patient.   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 Authorizing Provider  doxycycline (VIBRA-TABS) 100 MG tablet Take 100 mg by mouth 2 (two) times daily. 11/06/21  Yes [provider]  phenazopyridine (PYRIDIUM) 95 MG tablet Take 95 mg by mouth 3 (three) times daily as needed for pain.   Yes [provider]  triamcinolone cream (KENALOG) 0.1 % Apply a small pea-sized amount to affected area once to twice a day as needed. 11/07/21  Yes Myiah Petkus, Nicholes Stairs, NP  albuterol (PROVENTIL HFA;VENTOLIN HFA) 108 (90 Base) MCG/ACT inhaler Inhale 2 puffs into the lungs every 6 (six) hours as needed. 07/28/18   Schaevitz, Randall An, MD  diphenhydrAMINE (BENADRYL) 50 MG capsule Take 50 mg by mouth every 6 (six) hours as needed for itching.    [provider]  EPINEPHrine (EPIPEN 2-PAK) 0.3 mg/0.3 mL IJ SOAJ injection Inject 0.3 mLs (0.3 mg total) into the muscle as needed for up to 1 dose for anaphylaxis. 01/30/19  Arta Silence, MD  fluticasone (FLONASE) 50 MCG/ACT nasal spray Place 2 sprays into both nostrils daily.    [provider]    Family History Family History  Problem Relation Age of Onset   Hypertension Mother    Rheum arthritis Mother     Social History Social History   Tobacco Use   Smoking status: Never   Smokeless tobacco: Never  Vaping Use   Vaping Use: Never used  Substance Use Topics   Alcohol use: Yes    Comment: socially   Drug use: No     Allergies   Peanut oil, Penicillins, Shellfish allergy, Shrimp  extract allergy skin test, Amoxicillin, Grass extracts [gramineae pollens], Peanut-containing drug products, and Tomato   Review of Systems Review of Systems  Constitutional:  Negative for activity change, appetite change, chills, fatigue and fever.  HENT:  Negative for mouth sores, sore throat and trouble swallowing.   Respiratory:  Negative for cough, shortness of breath and wheezing.   Gastrointestinal:  Negative for abdominal pain, nausea and vomiting.  Genitourinary:  Positive for dysuria, frequency, genital sores and penile pain. Negative for decreased urine volume, flank pain, hematuria, penile discharge, penile swelling, scrotal swelling and testicular pain.  Musculoskeletal:  Negative for arthralgias, back pain and myalgias.  Skin:  Positive for rash. Negative for color change.  Allergic/Immunologic: Positive for environmental allergies and food allergies. Negative for immunocompromised state.  Neurological:  Negative for dizziness, tremors, seizures, syncope, weakness, numbness and headaches.  Hematological:  Negative for adenopathy. Does not bruise/bleed easily.    Physical Exam Triage Vital Signs ED Triage Vitals  Enc Vitals Group     BP 11/07/21 1626 (!) 133/103     Pulse Rate 11/07/21 1626 88     Resp 11/07/21 1626 18     Temp 11/07/21 1626 98.2 F (36.8 C)     Temp Source 11/07/21 1626 Oral     SpO2 11/07/21 1626 100 %     Weight 11/07/21 1629 211 lb (95.7 kg)     Height 11/07/21 1629 5\' 11"  (1.803 m)     Head Circumference --      Peak Flow --      Pain Score 11/07/21 1625 7     Pain Loc --      Pain Edu? --      Excl. in [place]? --    No data found.  Updated Vital Signs BP (!) 143/90 (BP Location: Left Arm)    Pulse 88    Temp 98.2 F (36.8 C) (Oral)    Resp 18    Ht 5\' 11"  (1.803 m)    Wt 211 lb (95.7 kg)    SpO2 100%    BMI 29.43 kg/m   Visual Acuity Right Eye Distance:   Left Eye Distance:   Bilateral Distance:    Right Eye Near:   Left Eye Near:     Bilateral Near:     Physical Exam Vitals and nursing note reviewed. Exam conducted with a chaperone present.  Constitutional:      General: He is awake. He is not in acute distress.    Appearance: He is well-developed and well-groomed. He is not ill-appearing.     Comments: He is sitting on the exam table in no acute distress but appears anxious.   HENT:     Head: Normocephalic and atraumatic.     Right Ear: Hearing normal.     Left Ear: Hearing normal.  Eyes:  Extraocular Movements: Extraocular movements intact.     Conjunctiva/sclera: Conjunctivae normal.  Cardiovascular:     Rate and Rhythm: Normal rate and regular rhythm.     Heart sounds: Normal heart sounds. No murmur heard. Pulmonary:     Effort: Pulmonary effort is normal.     Breath sounds: Normal breath sounds.  Abdominal:     General: Abdomen is flat. Bowel sounds are normal. There is no distension.     Palpations: Abdomen is soft.     Tenderness: There is no abdominal tenderness. There is no right CVA tenderness, left CVA tenderness, guarding or rebound.     Hernia: There is no hernia in the left inguinal area or right inguinal area.  Genitourinary:    Pubic Area: No rash.      Penis: Circumcised. Lesions present. No erythema, tenderness, discharge or swelling.      Testes: Normal.     Epididymis:     Right: Normal.     Left: Normal.       Comments: 3- 4 small pinpoint red slightly raised papular lesions present on glans of penis. 1 lesion at base of shaft of penis. No ulcers or vesicular lesions present. Non-tender. Lesions are not open. No drainage. No swelling or surrounding redness. No penile discharge present.  No lesions on scrotum.  Musculoskeletal:        General: Normal range of motion.  Lymphadenopathy:     Lower Body: No right inguinal adenopathy. No left inguinal adenopathy.  Skin:    General: Skin is warm and dry.     Capillary Refill: Capillary refill takes less than 2 seconds.     Findings:  Lesion and rash present. No bruising, ecchymosis or erythema. Rash is papular. Rash is not crusting, pustular, scaling or vesicular.  Neurological:     General: No focal deficit present.     Mental Status: He is alert and oriented to person, place, and time.  Psychiatric:        Attention and Perception: Attention normal.        Mood and Affect: Mood is anxious.        Speech: Speech normal.        Behavior: Behavior is cooperative.        Thought Content: Thought content normal.        Cognition and Memory: Cognition normal.        Judgment: Judgment normal.     UC Treatments / Results  Labs (all labs ordered are listed, but only abnormal results are displayed) Labs Reviewed - No data to display  EKG   Radiology No results found.  Procedures Procedures (including critical care time)  Medications Ordered in UC Medications - No data to display  Initial Impression / Assessment and Plan / UC Course  I have reviewed the triage vital signs and the nursing notes.  Pertinent labs & imaging results that were available during my care of the patient were reviewed by me and considered in my medical decision making (see chart for details).     Discussed with patient that lesions appear to be a contact irritation/dermatitis. Do not appear vesicular or HSV. Do not appear to be syphilis-like lesion. Since patient already had recent UA and STD testing which was negative, do not feel additional testing is needed today. Wife is being tested here today for STD's and will re-evaluate if her testing is positive. Discussed that the recent harsh soap use may be the cause of his dysuria  and subsequent lesions he sees today. Avoid harsh soaps and use mild Dove soap. Discussed that he has no distinct infection and since no change in symptoms since starting Doxycycline, may stop for now. Avoid sexual intercourse until he sees the Urologist in 5 days. Discussed use of mild steroid cream to help with  irritation but reviewed to use sparingly in genital region. May try Triamcinolone cream- apply once to up to twice a day to lesions as needed. Follow-up with his Urologist as planned on 11/12/21.  Final Clinical Impressions(s) / UC Diagnoses   Final diagnoses:  Dysuria  Penile rash     Discharge Instructions      Recommend start Triamcinolone cream- apply once to twice a day to affected areas as needed- use sparingly. Continue to push fluids. Use mild soap and avoid any harsh soaps or body washes. Avoid sexual intercourse for the next 5 days to minimize any irritation. Recommend stop Doxycycline for now. Follow-up with the Urologist as planned on 11/12/2021.     ED Prescriptions     Medication Sig Dispense Auth. Provider   triamcinolone cream (KENALOG) 0.1 % Apply a small pea-sized amount to affected area once to twice a day as needed. 15 g Katy Apo, NP      PDMP not reviewed this encounter.   Katy Apo, NP 11/08/21 1439

## 2021-11-07 NOTE — ED Triage Notes (Signed)
Patient is here for "STI Testing". Went to strip club recently, no unprotected sex. then dysuria once getting up to pee, then this continues. Went to PCP and Hospital checked for [facility] & UTI "Negative". Now with "sore patches in private area". Still occasional dysuria. No discharge. Taking Doxycycline.

## 2022-02-04 ENCOUNTER — Ambulatory Visit
Admission: EM | Admit: 2022-02-04 | Discharge: 2022-02-04 | Disposition: A | Discharge status: Home / Self Care | Payer: BC Managed Care – PPO

## 2022-04-15 ENCOUNTER — Emergency Department
Admission: EM | Admit: 2022-04-15 | Discharge: 2022-04-15 | Disposition: A | Discharge status: Home / Self Care | Payer: BC Managed Care – PPO | Attending: Emergency Medicine | Admitting: Emergency Medicine

## 2022-04-15 ENCOUNTER — Encounter: Payer: Self-pay | Admitting: Emergency Medicine

## 2022-04-15 ENCOUNTER — Other Ambulatory Visit: Payer: Self-pay

## 2022-04-15 DIAGNOSIS — E860 Dehydration: Secondary | ICD-10-CM | POA: Insufficient documentation

## 2022-04-15 DIAGNOSIS — R7309 Other abnormal glucose: Secondary | ICD-10-CM | POA: Insufficient documentation

## 2022-04-15 LAB — CBC WITH DIFFERENTIAL/PLATELET
Abs Immature Granulocytes: 0.01 10*3/uL (ref 0.00–0.07)
Basophils Absolute: 0.1 10*3/uL (ref 0.0–0.1)
Basophils Relative: 1 %
Eosinophils Absolute: 0.1 10*3/uL (ref 0.0–0.5)
Eosinophils Relative: 1 %
HCT: 43.6 % (ref 39.0–52.0)
Hemoglobin: 15.1 g/dL (ref 13.0–17.0)
Immature Granulocytes: 0 %
Lymphocytes Relative: 25 %
Lymphs Abs: 1.7 10*3/uL (ref 0.7–4.0)
MCH: 31.5 pg (ref 26.0–34.0)
MCHC: 34.6 g/dL (ref 30.0–36.0)
MCV: 90.8 fL (ref 80.0–100.0)
Monocytes Absolute: 0.5 10*3/uL (ref 0.1–1.0)
Monocytes Relative: 8 %
Neutro Abs: 4.4 10*3/uL (ref 1.7–7.7)
Neutrophils Relative %: 65 %
Platelets: 309 10*3/uL (ref 150–400)
RBC: 4.8 MIL/uL (ref 4.22–5.81)
RDW: 12.1 % (ref 11.5–15.5)
WBC: 6.8 10*3/uL (ref 4.0–10.5)
nRBC: 0 % (ref 0.0–0.2)

## 2022-04-15 LAB — BASIC METABOLIC PANEL
Anion gap: 9 (ref 5–15)
BUN: 13 mg/dL (ref 6–20)
CO2: 26 mmol/L (ref 22–32)
Calcium: 9.1 mg/dL (ref 8.9–10.3)
Chloride: 101 mmol/L (ref 98–111)
Creatinine, Ser: 1.26 mg/dL — ABNORMAL HIGH (ref 0.61–1.24)
GFR, Estimated: >60 mL/min (ref >60)
Glucose, Bld: 103 mg/dL — ABNORMAL HIGH (ref 70–99)
Potassium: 3.8 mmol/L (ref 3.5–5.1)
Sodium: 136 mmol/L (ref 135–145)

## 2022-11-10 IMAGING — CT CT HEAD W/O CM
3 series · 16 of 47 positions shown, 19 images · non-contrast
Comparison: Head CT 08/31/2020

CLINICAL DATA: Headache and fatigue.

Headache, tension-type headache, red eyes, fatigue
EXAM:
CT HEAD WITHOUT CONTRAST
TECHNIQUE: Contiguous axial images were obtained from the base of the skull
through the vertex without intravenous contrast.

[Series 3: head wo · axial · 0.48mm/px · z∈[+417,+567]mm · 10 of 36 slices shown, 13 images]
[im 3/36  brain]
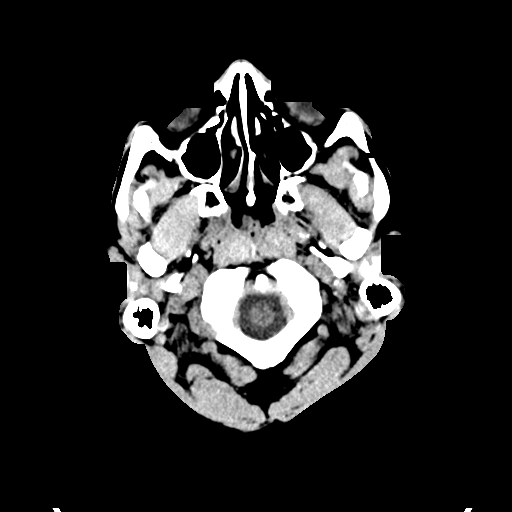
[im 3/36  bone]
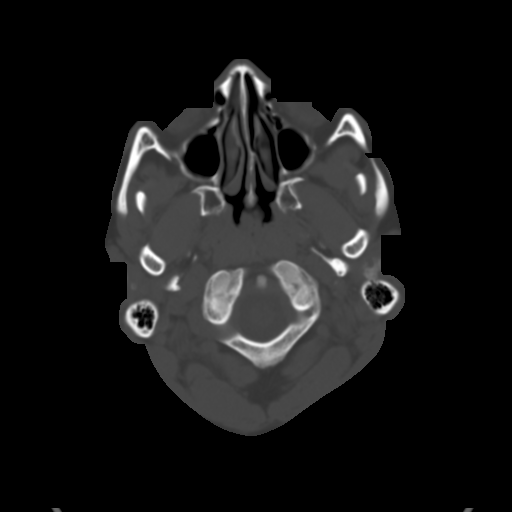
[im 7/36  brain]
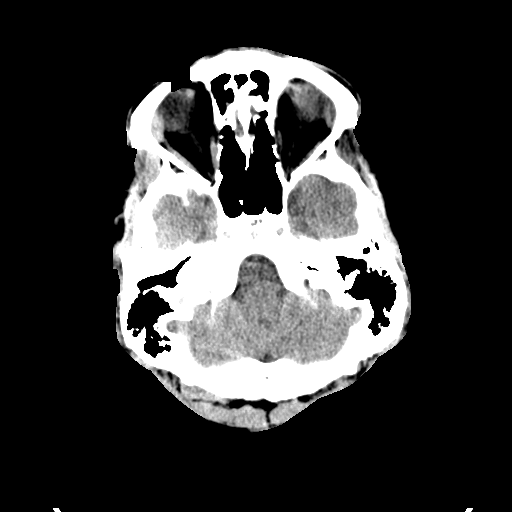
[im 10/36  brain]
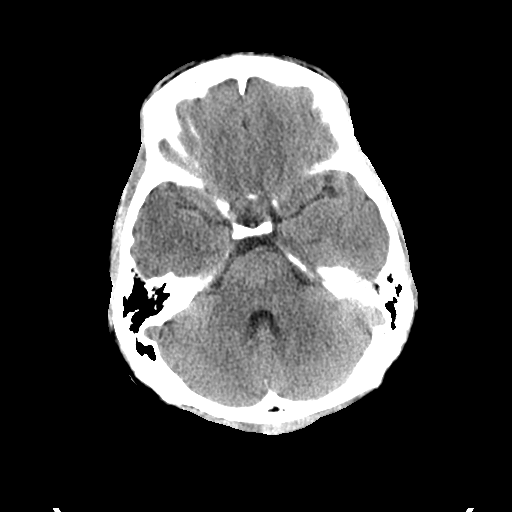
[im 13/36  brain]
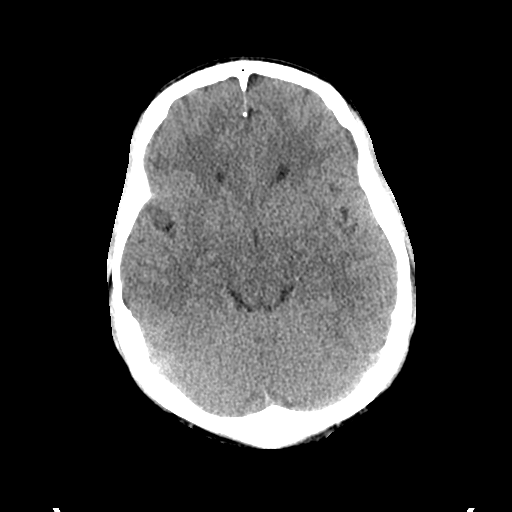
[im 16/36  brain]
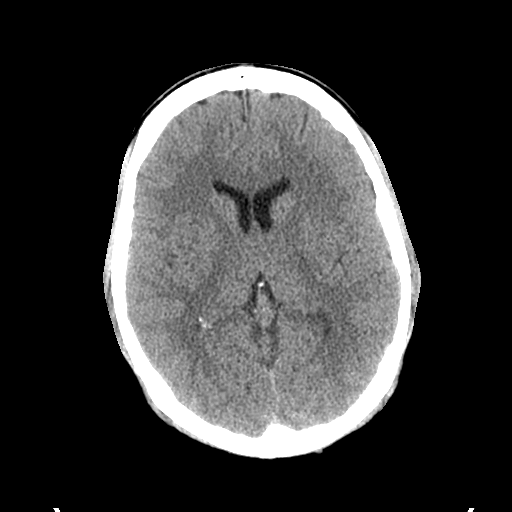
[im 16/36  bone]
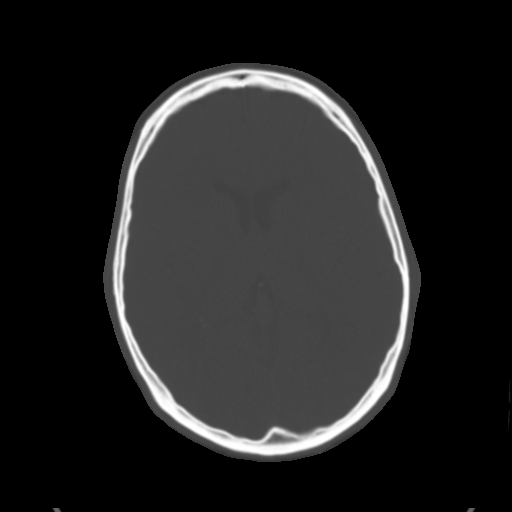
[im 20/36  brain]
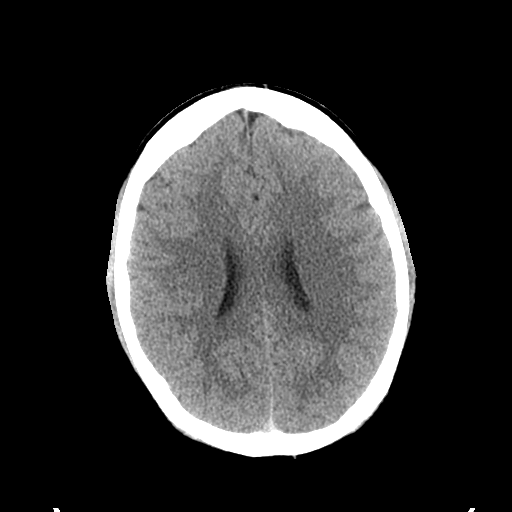
[im 23/36  brain]
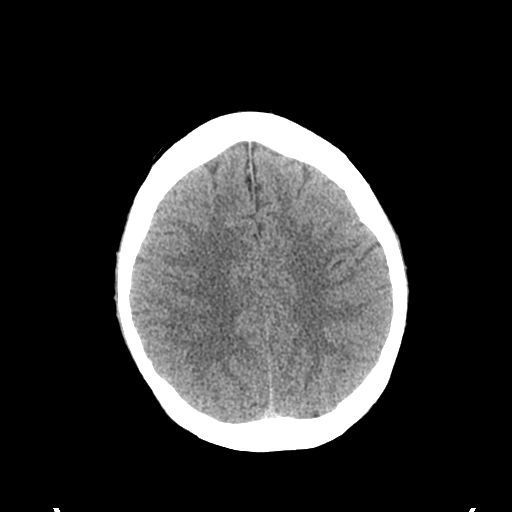
[im 27/36  brain]
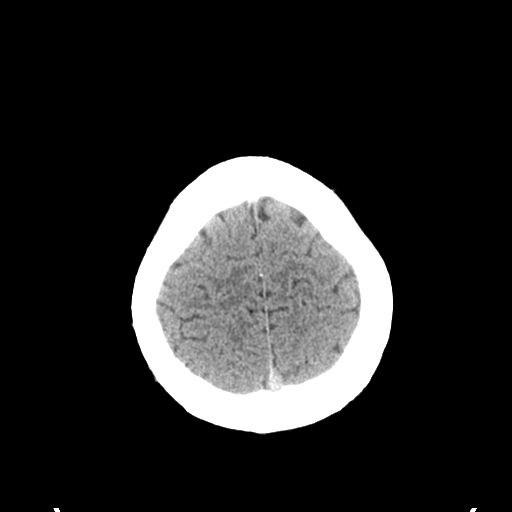
[im 29/36  brain]
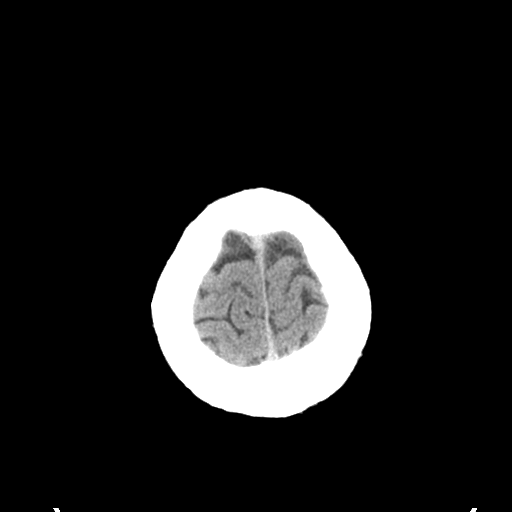
[im 29/36  bone]
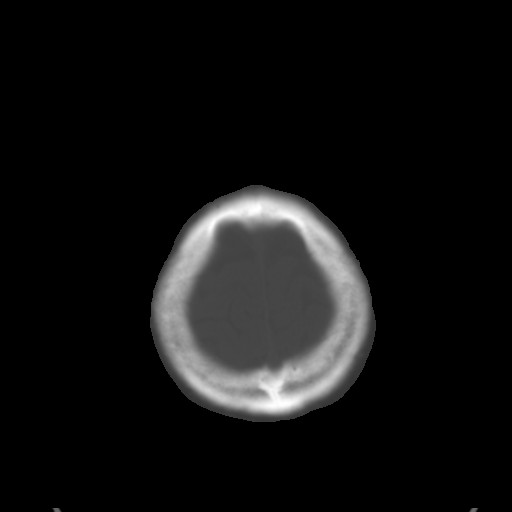
[im 33/36  brain]
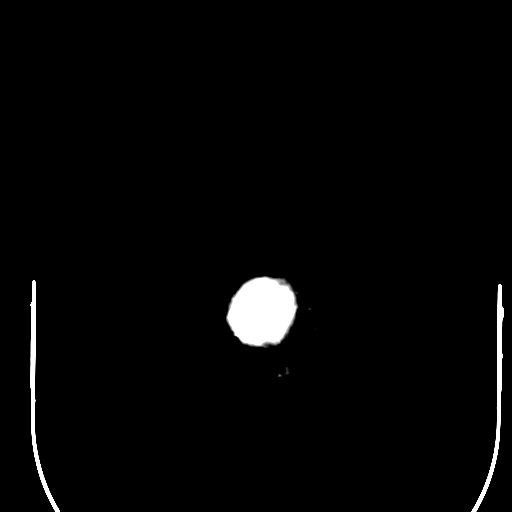

[Series 5: coronal soft tissue · coronal · 0.37mm/px · 3 of 75 slices shown]
[im 25/75  brain]
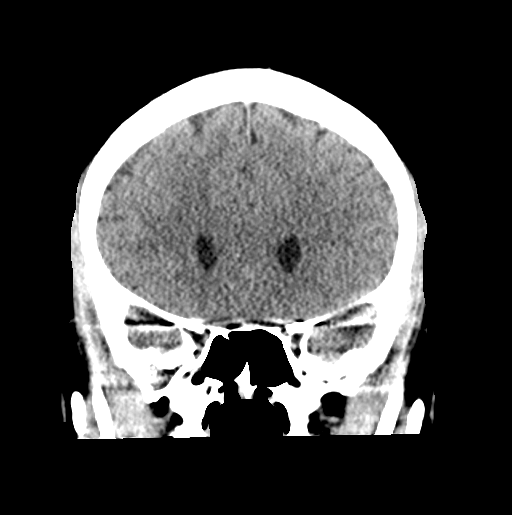
[im 33/75  brain]
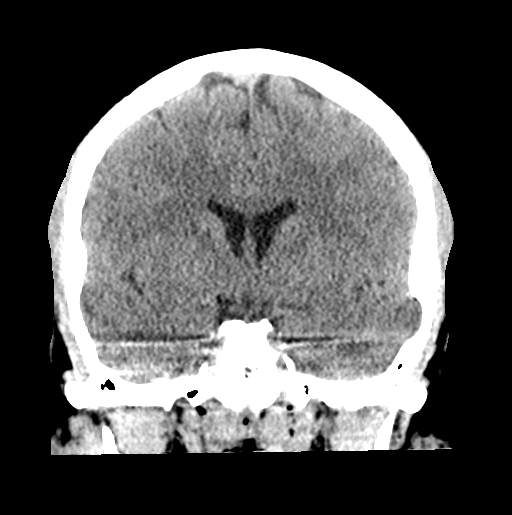
[im 42/75  brain]
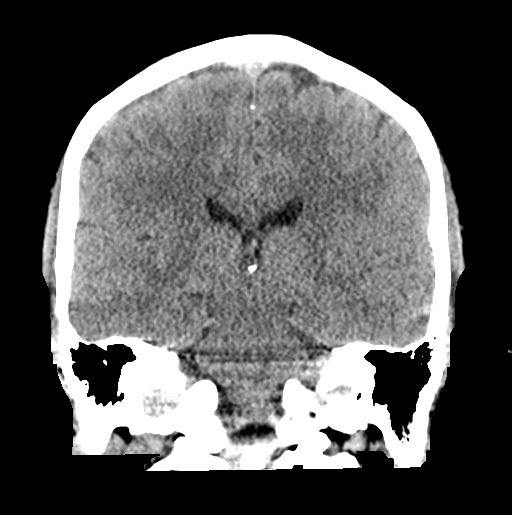

[Series 6: sagittal soft tissue · sagittal · 0.36mm/px · 3 of 63 slices shown]
[im 21/63  brain]
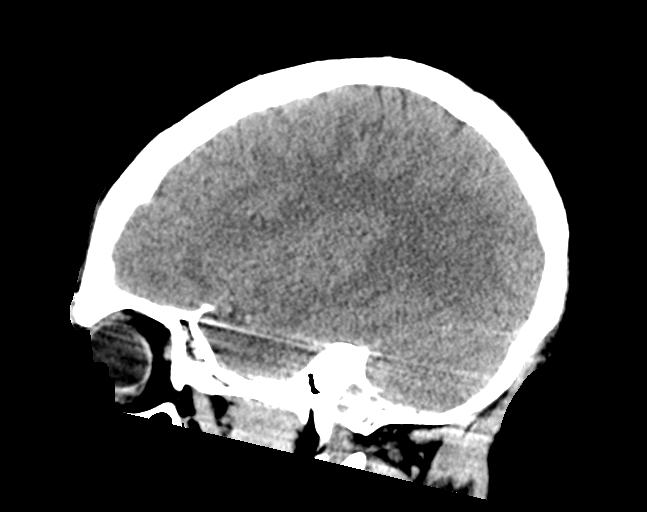
[im 32/63  brain]
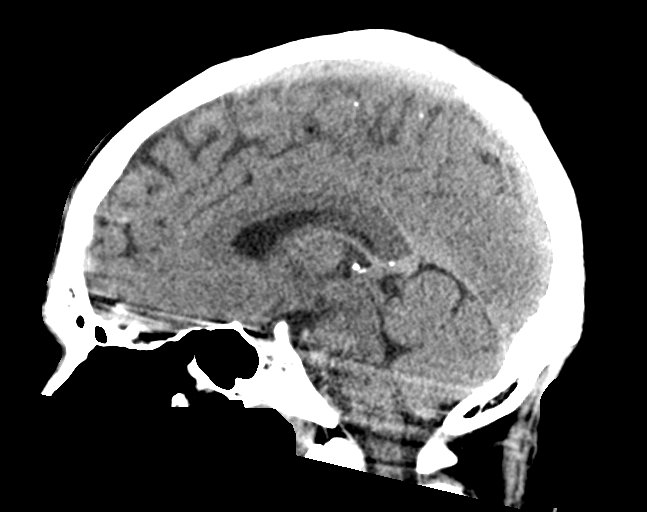
[im 42/63  brain]
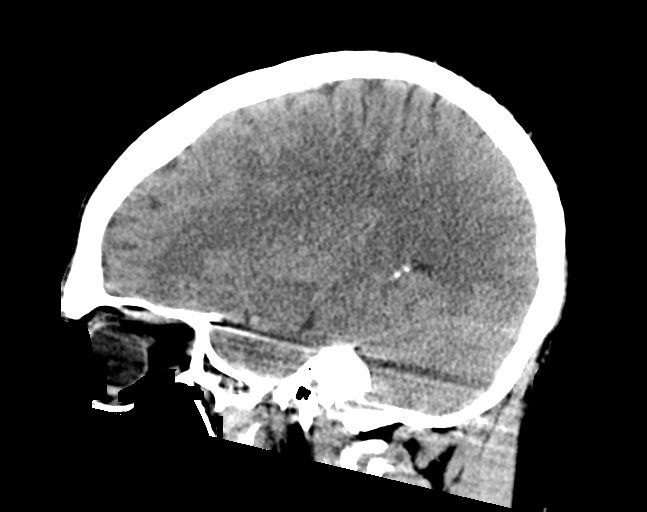

[16 of 47 positions shown; findings below may reference images not displayed]

FINDINGS: Brain: No intracranial hemorrhage, mass effect, or midline shift. No
hydrocephalus. The basilar cisterns are patent. No evidence of
territorial infarct or acute ischemia. No extra-axial or
intracranial fluid collection.

Vascular: No hyperdense vessel or unexpected calcification.

Skull: Normal. Negative for fracture or focal lesion.

Sinuses/Orbits: Paranasal sinuses and mastoid air cells are clear.
The visualized orbits are unremarkable.

Other: None.
IMPRESSION: Negative noncontrast head CT.

## 2023-01-14 ENCOUNTER — Ambulatory Visit: Payer: Self-pay

## 2023-04-20 ENCOUNTER — Other Ambulatory Visit: Payer: Self-pay

## 2023-04-20 ENCOUNTER — Emergency Department: Payer: Self-pay

## 2023-04-20 ENCOUNTER — Emergency Department
Admission: EM | Admit: 2023-04-20 | Discharge: 2023-04-20 | Disposition: A | Discharge status: Home / Self Care | Payer: Self-pay | Attending: Emergency Medicine | Admitting: Emergency Medicine

## 2023-04-20 DIAGNOSIS — J45909 Unspecified asthma, uncomplicated: Secondary | ICD-10-CM | POA: Insufficient documentation

## 2023-04-20 DIAGNOSIS — K2100 Gastro-esophageal reflux disease with esophagitis, without bleeding: Secondary | ICD-10-CM | POA: Insufficient documentation

## 2023-04-20 LAB — CBC WITH DIFFERENTIAL/PLATELET
Abs Immature Granulocytes: 0.02 10*3/uL (ref 0.00–0.07)
Basophils Absolute: 0.1 10*3/uL (ref 0.0–0.1)
Basophils Relative: 1 %
Eosinophils Absolute: 0.1 10*3/uL (ref 0.0–0.5)
Eosinophils Relative: 1 %
HCT: 44.3 % (ref 39.0–52.0)
Hemoglobin: 14.9 g/dL (ref 13.0–17.0)
Immature Granulocytes: 0 %
Lymphocytes Relative: 26 %
Lymphs Abs: 2.2 10*3/uL (ref 0.7–4.0)
MCH: 30.9 pg (ref 26.0–34.0)
MCHC: 33.6 g/dL (ref 30.0–36.0)
MCV: 91.9 fL (ref 80.0–100.0)
Monocytes Absolute: 0.6 10*3/uL (ref 0.1–1.0)
Monocytes Relative: 7 %
Neutro Abs: 5.7 10*3/uL (ref 1.7–7.7)
Neutrophils Relative %: 65 %
Platelets: 329 10*3/uL (ref 150–400)
RBC: 4.82 MIL/uL (ref 4.22–5.81)
RDW: 12.5 % (ref 11.5–15.5)
WBC: 8.7 10*3/uL (ref 4.0–10.5)
nRBC: 0 % (ref 0.0–0.2)

## 2023-04-20 LAB — BASIC METABOLIC PANEL
Anion gap: 10 (ref 5–15)
BUN: 18 mg/dL (ref 6–20)
CO2: 24 mmol/L (ref 22–32)
Calcium: 9.4 mg/dL (ref 8.9–10.3)
Chloride: 102 mmol/L (ref 98–111)
Creatinine, Ser: 1.12 mg/dL (ref 0.61–1.24)
GFR, Estimated: >60 mL/min (ref >60)
Glucose, Bld: 100 mg/dL — ABNORMAL HIGH (ref 70–99)
Potassium: 3.6 mmol/L (ref 3.5–5.1)
Sodium: 136 mmol/L (ref 135–145)

## 2023-04-20 LAB — TROPONIN I (HIGH SENSITIVITY): Troponin I (High Sensitivity): 3 ng/L (ref <18)

## 2023-04-20 LAB — LIPASE, BLOOD: Lipase: 49 U/L (ref 11–51)

## 2023-04-20 MED ORDER — EPINEPHRINE 0.3 MG/0.3ML IJ SOAJ: 0.3000 mg | INTRAMUSCULAR | 0 refills | Status: DC | PRN

## 2023-04-20 NOTE — ED Provider Notes (Signed)
[facility] Emergency Department Provider Note     Event Date/Time   First MD Initiated Contact with Patient 04/20/23 2037     (approximate)   History   Chest Pain and Abdominal Pain   HPI  Bruce Woodard is a 37 y.o. male with a history of asthma and GERD, presents to the ED for evaluation of ongoing acid reflux symptoms.  He reports previously prescribed medications have not been helping.  Patient was apparently taking OTC Prilosec until completion, and has not started on the prescription pantoprazole provided by the previous ED provider.  He presents noting burning sensation in his upper abdomen and chest.  Patient denies any shortness of breath, cough, congestion, or emesis.  The patient reports a significant allergy to multiple foods, which requires him to keep an EpiPen available.     Physical Exam   Triage Vital Signs: ED Triage Vitals  Enc Vitals Group     BP 04/20/23 2021 115/83     Pulse Rate 04/20/23 2021 72     Resp 04/20/23 2021 18     Temp 04/20/23 2021 98.4 F (36.9 C)     Temp Source 04/20/23 2021 Oral     SpO2 04/20/23 2021 100 %     Weight 04/20/23 2020 217 lb (98.4 kg)     Height 04/20/23 2020 5\' 11"  (1.803 m)     Head Circumference --      Peak Flow --      Pain Score 04/20/23 2020 10     Pain Loc --      Pain Edu? --      Excl. in GC? --     Most recent vital signs: Vitals:   04/20/23 2021 04/20/23 2210  BP: 115/83 116/82  Pulse: 72 74  Resp: 18 16  Temp: 98.4 F (36.9 C) 98.6 F (37 C)  SpO2: 100% 99%    General Awake, no distress.  NAD HEENT NCAT. PERRL. EOMI. No rhinorrhea. Mucous membranes are moist.  Uvula is midline and tonsils are flat.  No oropharyngeal irritation is appreciated. CV:  Good peripheral perfusion.  RRR RESP:  Normal effort.  ABD:  No distention.   ED Results / Procedures / Treatments   Labs (all labs ordered are listed, but only abnormal results are displayed) Labs Reviewed  BASIC  METABOLIC PANEL - Abnormal; Notable for the following components:      Result Value   Glucose, Bld 100 (*)    All other components within normal limits  CBC WITH DIFFERENTIAL/PLATELET  LIPASE, BLOOD  TROPONIN I (HIGH SENSITIVITY)    EKG  EKG reviewed by me reveals a ventricular rate of 65 bpm PR interval of 220 MS QRS duration of 86 MS Sinus rhythm with evidence of first-degree AV block. No STEMI  RADIOLOGY  I personally viewed and evaluated these images as part of my medical decision making, as well as reviewing the written report by the radiologist.  ED Provider Interpretation: No acute findings  CXR  IMPRESSION: No active cardiopulmonary disease.  PROCEDURES:  Critical Care performed: No  Procedures   MEDICATIONS ORDERED IN ED: Medications - No data to display   IMPRESSION / MDM / ASSESSMENT AND PLAN / ED COURSE  I reviewed the triage vital signs and the nursing notes.                              Differential  diagnosis includes, but is not limited to, biliary disease (biliary colic, acute cholecystitis, cholangitis, choledocholithiasis, etc), intrathoracic causes for epigastric abdominal pain including ACS, gastritis, duodenitis, pancreatitis, small bowel or large bowel obstruction, abdominal aortic aneurysm, hernia, and ulcer(s).   Patient's presentation is most consistent with acute complicated illness / injury requiring diagnostic workup.  Patient's diagnosis is consistent with gastroenteritis.  Patient will be discharged home with prescriptions for epinephrine pen.  Patient is to follow up with his PCP or GI medicine as discussed, as needed or otherwise directed. Patient is given ED precautions to return to the ED for any worsening or new symptoms.     FINAL CLINICAL IMPRESSION(S) / ED DIAGNOSES   Final diagnoses:  Gastroesophageal reflux disease with esophagitis without hemorrhage     Rx / DC Orders   ED Discharge Orders          Ordered     EPINEPHrine (EPIPEN 2-PAK) 0.3 mg/0.3 mL IJ SOAJ injection  As needed        04/20/23 2158             Note:  This document was prepared using Dragon voice recognition software and may include unintentional dictation errors.    Lissa Hoard, PA-C 04/21/23 2332    Jene Every, MD 04/22/23 1046

## 2023-04-20 NOTE — Discharge Instructions (Signed)
Your exam, labs, and workup are negative for any cardiac cause of your epigastric pain and chest pain.  Symptoms likely due to fully controlled reflux disease.  Take the prescription meds provided by the previous ER provider.  You should strongly consider follow-up with gastroenterology for further evaluation of your symptoms.  You should also consider establishing care with a primary care provider. Please go to the following website to schedule new (and existing) patient appointments:   [email or web address]   The following is a list of primary care offices in the area who are accepting new patients at this time.  Please reach out to one of them directly and let them know you would like to schedule an appointment to follow up on an Emergency Department visit, and/or to establish a new primary care provider (PCP).  There are likely other primary care clinics in the are who are accepting new patients, but this is an excellent place to start:  [facility] Lead physician: Dr Shirlee Latch [address] [place], [place] [address] [phone]  [facility] Lead Physician: Dr Alba Cory [address], [place], [place] [address] [phone]  [facility]  Lead Physician: Dr Olevia Perches [address] [place], [place] [address] [phone]  [facility] Lead Physician: Dr Sofie Hartigan [address] [place], [place] [address] [phone]  [facility] at [facility] Lead Physician: Dr Bari Edward [address] [place], [place], [place] [address] [phone]

## 2023-04-20 NOTE — ED Triage Notes (Addendum)
Pt states he had an allergic reaction last month and since that he developed neck pain and abd pain. Pt was seen at [facility]  and dx with acid reflux and given medications that haven't been helping. Pt states he has burning feeling in his upper abd and chest.

## 2023-04-23 ENCOUNTER — Emergency Department
Admission: EM | Admit: 2023-04-23 | Discharge: 2023-04-23 | Disposition: A | Discharge status: Home / Self Care | Payer: Self-pay | Attending: Emergency Medicine | Admitting: Emergency Medicine

## 2023-04-23 DIAGNOSIS — R1012 Left upper quadrant pain: Secondary | ICD-10-CM | POA: Insufficient documentation

## 2023-04-23 DIAGNOSIS — J45909 Unspecified asthma, uncomplicated: Secondary | ICD-10-CM | POA: Insufficient documentation

## 2023-04-23 DIAGNOSIS — I10 Essential (primary) hypertension: Secondary | ICD-10-CM | POA: Insufficient documentation

## 2023-04-23 LAB — CBC
HCT: 44.1 % (ref 39.0–52.0)
Hemoglobin: 14.9 g/dL (ref 13.0–17.0)
MCH: 30.5 pg (ref 26.0–34.0)
MCHC: 33.8 g/dL (ref 30.0–36.0)
MCV: 90.4 fL (ref 80.0–100.0)
Platelets: 320 10*3/uL (ref 150–400)
RBC: 4.88 MIL/uL (ref 4.22–5.81)
RDW: 12.2 % (ref 11.5–15.5)
WBC: 7 10*3/uL (ref 4.0–10.5)
nRBC: 0 % (ref 0.0–0.2)

## 2023-04-23 LAB — COMPREHENSIVE METABOLIC PANEL
ALT: 53 U/L — ABNORMAL HIGH (ref 0–44)
AST: 35 U/L (ref 15–41)
Albumin: 4.5 g/dL (ref 3.5–5.0)
Alkaline Phosphatase: 87 U/L (ref 38–126)
Anion gap: 7 (ref 5–15)
BUN: 12 mg/dL (ref 6–20)
CO2: 27 mmol/L (ref 22–32)
Calcium: 9.3 mg/dL (ref 8.9–10.3)
Chloride: 102 mmol/L (ref 98–111)
Creatinine, Ser: 1.07 mg/dL (ref 0.61–1.24)
GFR, Estimated: >60 mL/min (ref >60)
Glucose, Bld: 101 mg/dL — ABNORMAL HIGH (ref 70–99)
Potassium: 3.8 mmol/L (ref 3.5–5.1)
Sodium: 136 mmol/L (ref 135–145)
Total Bilirubin: 0.6 mg/dL (ref 0.3–1.2)
Total Protein: 7.6 g/dL (ref 6.5–8.1)

## 2023-04-23 LAB — LIPASE, BLOOD: Lipase: 44 U/L (ref 11–51)

## 2023-04-23 LAB — URINALYSIS, ROUTINE W REFLEX MICROSCOPIC
Bilirubin Urine: NEGATIVE
Glucose, UA: NEGATIVE mg/dL
Hgb urine dipstick: NEGATIVE
Ketones, ur: NEGATIVE mg/dL
Nitrite: NEGATIVE
Protein, ur: NEGATIVE mg/dL
Specific Gravity, Urine: 1.011 (ref 1.005–1.030)
Squamous Epithelial / HPF: NONE SEEN /HPF (ref 0–5)
pH: 5 (ref 5.0–8.0)

## 2023-04-23 MED ORDER — ALUM & MAG HYDROXIDE-SIMETH 200-200-20 MG/5ML PO SUSP
15.0000 mL | Freq: Once | ORAL | Status: AC
  Administered 2023-04-23: 15 mL via ORAL
  Filled 2023-04-23: qty 30

## 2023-04-23 MED ORDER — SUCRALFATE 1 G PO TABS: 1.0000 g | ORAL_TABLET | Freq: Once | ORAL | Status: DC

## 2023-04-23 MED ORDER — FAMOTIDINE 20 MG PO TABS
20.0000 mg | ORAL_TABLET | Freq: Once | ORAL | Status: AC
  Administered 2023-04-23: 20 mg via ORAL
  Filled 2023-04-23: qty 1

## 2023-04-23 NOTE — ED Triage Notes (Signed)
Pt comes with c/o abdominal pain. Pt states this has been going on for over month. Pt states he has been seen here but they did a chest pain workout more so. Pt states pain is continued. Pt denies any blood in urine or stool. Pt states left sided pain.

## 2023-04-23 NOTE — Discharge Instructions (Addendum)
Please follow-up with gastroenterology and also I recommend you establish a primary care doctor whom can also follow-up with you.  We discussed and it was decided not to perform a CT scan today.  If your pain is getting worse, you have a fever, other new concerns or symptoms arise please return to the ER so we can further evaluate.  Please return to the emergency room right away if you are to develop a fever, severe nausea, your pain becomes severe or worsens, you are unable to keep food down, begin vomiting any dark or bloody fluid, you develop any dark or bloody stools, feel dehydrated, or other new concerns or symptoms arise.

## 2023-04-23 NOTE — ED Provider Notes (Signed)
[facility] Provider Note    Event Date/Time   First MD Initiated Contact with Patient 04/23/23 1316     (approximate)  History   Abdominal Pain  HPI  Bruce Woodard is a 37 y.o. male history of asthma and hypertension  Patient has been experiencing off-and-on pain in his left upper abdomen which he describes as a sort of hard to describe feeling for about a month now.  He denies having any chest pain.  He reports that he came to the ER a few days ago and they evaluated him for the chest pain, but really feels like it is more in his upper abdomen.  He feels like "indigestion" in his count of belching and burping frequently.  He has been taking an acid reflux medication with [facility] gave him recently.  He reports that seems to be slowly helping.  He continues to have symptoms though.  No fevers or chills.  No vomiting.  No diarrhea no black or bloody stool.  Still eating and drinking.  Things that seem to help are if he drinks Mylanta or takes antacids it will go away but then it comes back.     Physical Exam   Triage Vital Signs: ED Triage Vitals [04/23/23 1300]  Enc Vitals Group     BP (!) 137/97     Pulse Rate 60     Resp 17     Temp 98 F (36.7 C)     Temp src      SpO2 100 %     Weight      Height      Head Circumference      Peak Flow      Pain Score 6     Pain Loc      Pain Edu?      Excl. in GC?     Most recent vital signs: Vitals:   04/23/23 1300  BP: (!) 137/97  Pulse: 60  Resp: 17  Temp: 98 F (36.7 C)  SpO2: 100%     General: Awake, no distress.  CV:  Good peripheral perfusion.  Resp:  Normal effort.  Abd:  No distention.  Mild left upper quadrant discomfort.  No palpable masses or hernias.  Remainder the abdomen soft nontender nondistended throughout.  No peritonitis in the area. Other:  No costovertebral angle tenderness to palpation bilateral   ED Results / Procedures / Treatments   Labs (all labs  ordered are listed, but only abnormal results are displayed) Labs Reviewed  COMPREHENSIVE METABOLIC PANEL - Abnormal; Notable for the following components:      Result Value   Glucose, Bld 101 (*)    ALT 53 (*)    All other components within normal limits  URINALYSIS, ROUTINE W REFLEX MICROSCOPIC - Abnormal; Notable for the following components:   Color, Urine STRAW (*)    APPearance CLEAR (*)    Leukocytes,Ua MODERATE (*)    Bacteria, UA RARE (*)    All other components within normal limits  URINE CULTURE  LIPASE, BLOOD  CBC  H. PYLORI ANTIBODY, IGG   Reviewed notable for occasional rare bacteria and leukocytes in his urine.  Of note discussed with the patient he has no pain burning discomfort or urinary symptoms.  Comprehensive metabolic panel is normal with exception of very minimally [place] elevated ALT.  Normal white count  EKG     RADIOLOGY I discussed with the patient the risks and benefits of  abdominal CT scan. The present time there is no clear indication that the patient requires CT, the patient does have an abdominal complaint but exam does not suggest acute surgical abdomen and my suspicion for intra-abdominal infection including appendicitis, cholecystitis, aaa, dissection, ischemia, perforation, pancreatitis, diverticulitis or other acute major intra-abdominal process is quite low.  Utilize shared medical decision making, rather if the patient does have worsening symptoms, develops a high fever, develops pain or persistent discomfort in the right upper quadrant or right lower quadrant, or other new concerns arise he  will come back to emergency room right away. As the patient's clinician I think this is a very reasonable decision having discussed general risks and benefits of CT, and my clinical suspicion that CT would be of benefit at this time is very low.  Patient voiced that he would like to attempt follow-up with gastroenterology as a neck step, and I think this is  reasonable.  PROCEDURES:  Critical Care performed: No  Procedures   MEDICATIONS ORDERED IN ED: Medications  alum & mag hydroxide-simeth (MAALOX/MYLANTA) 200-200-20 MG/5ML suspension 15 mL (15 mLs Oral Given 04/23/23 1414)  famotidine (PEPCID) tablet 20 mg (20 mg Oral Given 04/23/23 1414)     IMPRESSION / MDM / ASSESSMENT AND PLAN / ED COURSE  I reviewed the triage vital signs and the nursing notes.                              Differential diagnosis includes, but is not limited to, gastritis, dyspepsia, peptic ulcer disease, helical back care, inflammatory bowel disease, etc.  His workup including laboratory studies very reassuring.  Discussed and offered CT imaging to evaluate for acute finding or concerns such as possible finding like tumor, abnormal mass, infection, inflammation etc. but patient declines this stating he would rather follow-up with GI.  He would like to continue his current medication which is a PPI, I have placed referral to GI as well as to establish primary care.  Discussed careful return precautions.  Patient agreement.  Does not wish to receive any new prescriptions but rather continue with the PPI he was prescribed at [facility].  No associated cardiopulmonary symptoms.  Patient's presentation is most consistent with acute complicated illness / injury requiring diagnostic workup.     Return precautions and treatment recommendations and follow-up discussed with the patient who is agreeable with the plan.      FINAL CLINICAL IMPRESSION(S) / ED DIAGNOSES   Final diagnoses:  LUQ pain     Rx / DC Orders   ED Discharge Orders          Ordered    Ambulatory referral to Gastroenterology       Comments: LUQ pain, reflux   04/23/23 1406    Ambulatory Referral to Primary Care (Establish Care)       Comments: LUQ pain, establish PCP   04/23/23 1407             Note:  This document was prepared using Dragon voice recognition software and may include  unintentional dictation errors.   Sharyn Creamer, MD 04/23/23 [phone]

## 2023-04-24 LAB — URINE CULTURE: Culture: NO GROWTH

## 2023-04-29 ENCOUNTER — Telehealth: Payer: Self-pay

## 2023-04-29 NOTE — Telephone Encounter (Signed)
Called the patient after receiving an incoming referral for the patient to come to the [facility]. I checked to make sure patient is not established with no other GI. Patient is not currently established with GI. Patient  didn't answer Bruce Woodard left a voicemail and advise him to call us back. The patient returned the call to schedule his appointment.

## 2023-05-01 ENCOUNTER — Ambulatory Visit (INDEPENDENT_AMBULATORY_CARE_PROVIDER_SITE_OTHER): Payer: 59 | Admitting: Gastroenterology

## 2023-05-01 ENCOUNTER — Encounter: Payer: Self-pay | Admitting: Gastroenterology

## 2023-05-01 VITALS — BP 115/79 | HR 62 | Temp 98.5°F | Ht 70.0 in | Wt 215.0 lb

## 2023-05-01 DIAGNOSIS — R6881 Early satiety: Secondary | ICD-10-CM | POA: Diagnosis not present

## 2023-05-01 DIAGNOSIS — K625 Hemorrhage of anus and rectum: Secondary | ICD-10-CM

## 2023-05-01 DIAGNOSIS — R1012 Left upper quadrant pain: Secondary | ICD-10-CM | POA: Diagnosis not present

## 2023-05-01 DIAGNOSIS — R634 Abnormal weight loss: Secondary | ICD-10-CM

## 2023-05-01 MED ORDER — NA SULFATE-K SULFATE-MG SULF 17.5-3.13-1.6 GM/177ML PO SOLN: 1.0000 | Freq: Once | ORAL | 0 refills | Status: AC

## 2023-05-01 NOTE — Progress Notes (Signed)
Gastroenterology Consultation  Referring Provider:     Sharyn Creamer, MD Primary Care Physician:  Pcp, No Primary Gastroenterologist:  Dr. Servando Woodard     Reason for Consultation:     Left upper abdominal pain        HPI:   Bruce Woodard is a 37 y.o. y/o male referred for consultation & management of left upper abdominal pain by Dr. Oneita Woodard, No.  This patient comes in today after being in the emergency department back in the beginning of of this month and was sent for an urgent referral to GI.  It appears that the patient had gone to the ER for left upper abdominal pain off and on for about a month.  The patient reported that it felt to be more like indigestion and was having burping frequently.  The patient was also seen at the end of June for the same issue.  The patient had a chest x-ray on June 30 in the ED but no other imaging at that time or on the next visit to the emergency department on July 3.  The patient had a slightly elevated ALT but is lipase and other labs did not show any cause for his symptoms. He has lost 13 lbs in the last 2 weeks. He says he does not eat because he gets full fast and has pain when he eats. The patient reports that in addition to the weight loss and abdominal pain he has early satiety.  There is also report of some rectal bleeding and he states that his mother had polyps in her 43s.  Past Medical History:  Diagnosis Date   Asthma    as a child    Past Surgical History:  Procedure Laterality Date   REPAIR EXTENSOR TENDON Right 11/14/2015   Procedure: REPAIR EXTENSOR TENDON, 3rd and 5th;  Surgeon: Bruce Flake, MD;  Location: [facility] ORS;  Service: Orthopedics;  Laterality: Right;    Prior to Admission medications   Medication Sig Start Date End Date Taking? Authorizing Provider  EPINEPHrine (EPIPEN 2-PAK) 0.3 mg/0.3 mL IJ SOAJ injection Inject 0.3 mg into the muscle as needed for up to 1 dose for anaphylaxis. 04/20/23   Menshew, Bruce Ivory, PA-C  fluticasone  (FLONASE) 50 MCG/ACT nasal spray Place 2 sprays into both nostrils daily. 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   Physical Exam:  There were no vitals taken for this visit. No LMP for male patient. General:   Alert,  Well-developed, well-nourished, pleasant and cooperative in NAD Head:  Normocephalic and atraumatic.  Eyes:  Sclera clear, no icterus.   Conjunctiva pink. Ears:  Normal auditory acuity. Neck:  Supple; no masses or thyromegaly. Lungs:  Respirations even and unlabored.  Clear throughout to auscultation.   No wheezes, crackles, or rhonchi. No acute distress. Heart:  Regular rate and rhythm; no murmurs, clicks, rubs, or gallops. Abdomen:  Normal bowel sounds.  No bruits.  Soft, non-tender and non-distended without masses, hepatosplenomegaly or hernias noted.  No guarding or rebound tenderness.  Negative Carnett sign.   Rectal:  Deferred.  Pulses:  Normal pulses noted. Extremities:  No clubbing or edema.  No cyanosis. Neurologic:  Alert and oriented x3;  grossly normal neurologically. Skin:  Intact without significant lesions or rashes.  No  jaundice. Lymph Nodes:  No significant cervical adenopathy. Psych:  Alert and cooperative. Normal mood and affect.  Imaging Studies: DG Chest 2 View  Result Date: 04/20/2023 CLINICAL DATA:  Chest pain EXAM: CHEST - 2 VIEW COMPARISON:  Chest x-ray December 22, 2020 FINDINGS: The cardiomediastinal silhouette is unchanged in contour. No focal pulmonary opacity. No pleural effusion or pneumothorax. The visualized upper abdomen is unremarkable. No acute osseous abnormality. IMPRESSION: No active cardiopulmonary disease. Electronically Signed   By: Bruce Woodard M.D.   On: 04/20/2023 20:35    Assessment and Plan:   Bruce Woodard is a 37 y.o. y/o male who comes in today with a history of rectal bleeding, early satiety, abdominal pain that is in the left upper quadrant and goes down to the left lower quadrant with intermittent pains on the right mid epigastric area the patient will be set up for an EGD and colonoscopy due to the above symptoms.  The patient has been explained the plan and agrees with it.  He will continue the PPI in the meantime.    Bruce Minium, MD. Bruce Woodard    Note: This dictation was prepared with Dragon dictation along with smaller phrase technology. Any transcriptional errors that result from this process are unintentional.

## 2023-05-01 NOTE — Addendum Note (Signed)
Addended by: Roena Malady on: 05/01/2023 01:34 PM   Modules accepted: Orders

## 2023-05-01 NOTE — Addendum Note (Signed)
Addended by: Roena Malady on: 05/01/2023 04:56 PM   Modules accepted: Orders

## 2023-05-03 ENCOUNTER — Other Ambulatory Visit: Payer: Self-pay

## 2023-05-03 ENCOUNTER — Emergency Department
Admission: EM | Admit: 2023-05-03 | Discharge: 2023-05-03 | Disposition: A | Discharge status: Home / Self Care | Payer: 59 | Attending: Emergency Medicine | Admitting: Emergency Medicine

## 2023-05-03 DIAGNOSIS — K2900 Acute gastritis without bleeding: Secondary | ICD-10-CM | POA: Diagnosis not present

## 2023-05-03 DIAGNOSIS — R1012 Left upper quadrant pain: Secondary | ICD-10-CM | POA: Diagnosis not present

## 2023-05-03 LAB — CBC
HCT: 45 % (ref 39.0–52.0)
Hemoglobin: 15.5 g/dL (ref 13.0–17.0)
MCH: 30.9 pg (ref 26.0–34.0)
MCHC: 34.4 g/dL (ref 30.0–36.0)
MCV: 89.8 fL (ref 80.0–100.0)
Platelets: 352 10*3/uL (ref 150–400)
RBC: 5.01 MIL/uL (ref 4.22–5.81)
RDW: 12.4 % (ref 11.5–15.5)
WBC: 7.2 10*3/uL (ref 4.0–10.5)
nRBC: 0 % (ref 0.0–0.2)

## 2023-05-03 LAB — URINALYSIS, ROUTINE W REFLEX MICROSCOPIC
Bilirubin Urine: NEGATIVE
Glucose, UA: NEGATIVE mg/dL
Hgb urine dipstick: NEGATIVE
Ketones, ur: NEGATIVE mg/dL
Leukocytes,Ua: NEGATIVE
Nitrite: NEGATIVE
Protein, ur: NEGATIVE mg/dL
Specific Gravity, Urine: 1.016 (ref 1.005–1.030)
pH: 7 (ref 5.0–8.0)

## 2023-05-03 LAB — COMPREHENSIVE METABOLIC PANEL
ALT: 27 U/L (ref 0–44)
AST: 21 U/L (ref 15–41)
Albumin: 4.2 g/dL (ref 3.5–5.0)
Alkaline Phosphatase: 83 U/L (ref 38–126)
Anion gap: 9 (ref 5–15)
BUN: 10 mg/dL (ref 6–20)
CO2: 27 mmol/L (ref 22–32)
Calcium: 9.7 mg/dL (ref 8.9–10.3)
Chloride: 104 mmol/L (ref 98–111)
Creatinine, Ser: 1.14 mg/dL (ref 0.61–1.24)
GFR, Estimated: >60 mL/min (ref >60)
Glucose, Bld: 112 mg/dL — ABNORMAL HIGH (ref 70–99)
Potassium: 3.9 mmol/L (ref 3.5–5.1)
Sodium: 140 mmol/L (ref 135–145)
Total Bilirubin: 0.5 mg/dL (ref 0.3–1.2)
Total Protein: 7.6 g/dL (ref 6.5–8.1)

## 2023-05-03 LAB — LIPASE, BLOOD: Lipase: 38 U/L (ref 11–51)

## 2023-05-03 MED ORDER — ALUM & MAG HYDROXIDE-SIMETH 200-200-20 MG/5ML PO SUSP
30.0000 mL | Freq: Once | ORAL | Status: DC
  Filled 2023-05-03: qty 30

## 2023-05-03 NOTE — ED Notes (Signed)
Pt refused IV for CT scan, provider notified.

## 2023-05-03 NOTE — ED Triage Notes (Signed)
Pt co of left upper bad pain. Pt states he was told he probably had stomach ulcers. Pt states his pain has not gotten better. Pt states he went to GI doc 2 days ago and was told everything was good, no meds given.

## 2023-05-03 NOTE — Discharge Instructions (Signed)
Please follow-up with your gastroenterologist as you have scheduled for your EGD and colonoscopy.  You did not wish to have a CAT scan today.  Please take your PPI 30 minutes before eating breakfast in the morning.  You may also take Maalox/Mylanta as needed.  Please return for any new, worsening, or change in symptoms or other concerns.

## 2023-05-03 NOTE — ED Provider Notes (Signed)
[facility] Provider Note    Event Date/Time   First MD Initiated Contact with Patient 05/03/23 1149     (approximate)   History   Abdominal Pain   HPI  Bruce Woodard is a 37 y.o. male who presents today for evaluation of left left upper quadrant pain.  Patient reports that this has been ongoing for the past several weeks to a month.  He reports that he started taking a PPI 2 weeks ago but has not noticed a benefit from this yet.  He reports that he went to a GI doctor couple of days ago, but "he did not do anything."  He reports that he has early satiety and a burning in his epigastric area.  He has not had any nausea, vomiting, diarrhea.  No fevers or chills.  Patient reports that when he came to the emergency department before the GI appointment he was told that he could have gotten a CAT scan but decided to hold off.  Patient is requesting this CAT scan today.  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   Laceration of extensor tendon of hand 11/23/2015          Physical Exam   Triage Vital Signs: ED Triage Vitals  Encounter Vitals Group     BP 05/03/23 1122 (!) 124/91     Systolic BP Percentile --      Diastolic BP Percentile --      Pulse Rate 05/03/23 1122 62     Resp 05/03/23 1122 18     Temp 05/03/23 1122 98 F (36.7 C)     Temp Source 05/03/23 1122 Oral     SpO2 05/03/23 1122 96 %     Weight 05/03/23 1128 215 lb (97.5 kg)     Height 05/03/23 1128 5\' 11"  (1.803 m)     Head Circumference --      Peak Flow --      Pain Score 05/03/23 1127 10     Pain Loc --      Pain Education --      Exclude from Growth Chart --     Most recent vital signs: Vitals:   05/03/23 1122  BP: (!) 124/91  Pulse: 62  Resp: 18  Temp: 98 F (36.7 C)  SpO2: 96%    Physical Exam Vitals and nursing note reviewed.  Constitutional:      General: Awake and alert. No acute distress.    Appearance: Normal appearance.  The patient is normal weight.  HENT:     Head: Normocephalic and atraumatic.     Mouth: Mucous membranes are moist.  Eyes:     General: PERRL. Normal EOMs        Right eye: No discharge.        Left eye: No discharge.     Conjunctiva/sclera: Conjunctivae normal.  Cardiovascular:     Rate and Rhythm: Normal rate and regular rhythm.     Pulses: Normal pulses.  Pulmonary:     Effort: Pulmonary effort is normal. No respiratory distress.     Breath sounds: Normal breath sounds.  Abdominal:     Abdomen is soft. There is no abdominal tenderness. No rebound or guarding. No distention. Mild LUQ discomfort.  No right upper quadrant or right lower quadrant tenderness. Musculoskeletal:        General: No swelling. Normal range of motion.     Cervical back: Normal range  of motion and neck supple.  Skin:    General: Skin is warm and dry.     Capillary Refill: Capillary refill takes less than 2 seconds.     Findings: No rash.  Neurological:     Mental Status: The patient is awake and alert.      ED Results / Procedures / Treatments   Labs (all labs ordered are listed, but only abnormal results are displayed) Labs Reviewed  COMPREHENSIVE METABOLIC PANEL - Abnormal; Notable for the following components:      Result Value   Glucose, Bld 112 (*)    All other components within normal limits  URINALYSIS, ROUTINE W REFLEX MICROSCOPIC - Abnormal; Notable for the following components:   Color, Urine YELLOW (*)    APPearance CLEAR (*)    All other components within normal limits  LIPASE, BLOOD  CBC     EKG     RADIOLOGY     PROCEDURES:  Critical Care performed:   Procedures   MEDICATIONS ORDERED IN ED: Medications  alum & mag hydroxide-simeth (MAALOX/MYLANTA) 200-200-20 MG/5ML suspension 30 mL (30 mLs Oral Patient Refused/Not Given 05/03/23 1301)     IMPRESSION / MDM / ASSESSMENT AND PLAN / ED COURSE  I reviewed the triage vital signs and the nursing  notes.   Differential diagnosis includes, but is not limited to, gastritis, peptic ulcer disease, pancreatitis, biliary colic, cholecystitis, H. Pylori.   I reviewed the patient's chart.  Patient was most recently seen in the emergency department on 04/23/2023 at which time he came in with left upper abdominal pain for 1 month.  He was also seen on 04/20/2023 for indigestion.  Patient taking a PPI, and then was also seen by GI on 05/01/2023.  They have set him up for an EGD and a colonoscopy.  He will continue the PPI in the meantime.  Patient is awake and alert, hemodynamically stable and afebrile.  He has minimal epigastric discomfort, no rebound or guarding.  Labs obtained in triage are overall reassuring.  LFTs and lipase within normal limits, no obstructive pattern.  White blood cell count is normal.  I agree with previous providers assessment that his symptoms are most consistent with gastritis or peptic ulcer disease.  Patient is quite adamant that he would like a CT scan today as this was offered to him during his last ER visit but he declined.  I discussed with him that his most likely etiology of his symptoms would not be visualized on CAT scan, and what he really needs is the EGD/colonoscopy that was scheduled for him by the gastroenterologist.  Patient is frustrated that this is not until September, he wants imaging done today.  We discussed the risks of radiation associated with CT imaging, the patient still would like to proceed with this today.  I was later informed by the RN that the patient changed his mind.  He no longer wants the imaging.  I went back in the room to discuss with the patient, and he reports that he did not realize that it takes 4 to 6 weeks for the PPI to kick in and he prefers to try this as an outpatient first.  He declined analgesia/antiemetic in the emergency department.  He will have his EGD and colonoscopy as scheduled.  We did discuss strict return precautions and the  importance of outpatient follow-up.  He understands that he may return at any time if he decides he does want the CAT scan.  We  also discussed gastric irritants that he should try to stay away from that might exacerbate his symptoms.  Patient understands and agrees with plan.  He was discharged in stable condition.   Patient's presentation is most consistent with exacerbation of chronic illness.   Clinical Course as of 05/03/23 1347  Sat May 03, 2023  1310 Patient changes mind does not wish to have a CAT scan today.  He wishes to be discharged [JP]  1311 He also does not wish to wait for the results of his urinalysis [JP]    Clinical Course User Index [JP] Boss Danielsen, Herb Grays, PA-C     FINAL CLINICAL IMPRESSION(S) / ED DIAGNOSES   Final diagnoses:  Acute gastritis without hemorrhage, unspecified gastritis type     Rx / DC Orders   ED Discharge Orders     None        Note:  This document was prepared using Dragon voice recognition software and may include unintentional dictation errors.   Keturah Shavers 05/03/23 1347    Dionne Bucy, MD 05/03/23 1914

## 2023-05-04 DIAGNOSIS — K2971 Gastritis, unspecified, with bleeding: Secondary | ICD-10-CM | POA: Diagnosis not present

## 2023-05-04 DIAGNOSIS — R1013 Epigastric pain: Secondary | ICD-10-CM | POA: Diagnosis not present

## 2023-05-04 DIAGNOSIS — R109 Unspecified abdominal pain: Secondary | ICD-10-CM | POA: Diagnosis not present

## 2023-05-04 DIAGNOSIS — Z880 Allergy status to penicillin: Secondary | ICD-10-CM | POA: Diagnosis not present

## 2023-05-04 DIAGNOSIS — K2970 Gastritis, unspecified, without bleeding: Secondary | ICD-10-CM | POA: Diagnosis not present

## 2023-05-06 ENCOUNTER — Telehealth: Payer: Self-pay | Admitting: Gastroenterology

## 2023-05-06 NOTE — Telephone Encounter (Signed)
Pt call to get results of CT scan please return call

## 2023-05-15 ENCOUNTER — Encounter: Payer: Self-pay | Admitting: Gastroenterology

## 2023-05-21 ENCOUNTER — Encounter: Payer: Self-pay | Admitting: Gastroenterology

## 2023-05-22 ENCOUNTER — Emergency Department
Admission: EM | Admit: 2023-05-22 | Discharge: 2023-05-22 | Disposition: A | Discharge status: Home / Self Care | Payer: 59 | Attending: Emergency Medicine | Admitting: Emergency Medicine

## 2023-05-22 ENCOUNTER — Ambulatory Visit: Admission: RE | Admit: 2023-05-22 | Payer: 59 | Source: Home / Self Care | Admitting: Gastroenterology

## 2023-05-22 ENCOUNTER — Other Ambulatory Visit: Payer: Self-pay

## 2023-05-22 ENCOUNTER — Encounter: Admission: RE | Payer: Self-pay | Source: Home / Self Care

## 2023-05-22 DIAGNOSIS — R110 Nausea: Secondary | ICD-10-CM | POA: Insufficient documentation

## 2023-05-22 DIAGNOSIS — R531 Weakness: Secondary | ICD-10-CM | POA: Diagnosis not present

## 2023-05-22 DIAGNOSIS — E860 Dehydration: Secondary | ICD-10-CM | POA: Diagnosis not present

## 2023-05-22 DIAGNOSIS — R0981 Nasal congestion: Secondary | ICD-10-CM | POA: Diagnosis not present

## 2023-05-22 DIAGNOSIS — Z91010 Allergy to peanuts: Secondary | ICD-10-CM | POA: Diagnosis not present

## 2023-05-22 DIAGNOSIS — Z20822 Contact with and (suspected) exposure to covid-19: Secondary | ICD-10-CM | POA: Diagnosis not present

## 2023-05-22 DIAGNOSIS — J45909 Unspecified asthma, uncomplicated: Secondary | ICD-10-CM | POA: Insufficient documentation

## 2023-05-22 DIAGNOSIS — R5381 Other malaise: Secondary | ICD-10-CM | POA: Diagnosis not present

## 2023-05-22 DIAGNOSIS — R519 Headache, unspecified: Secondary | ICD-10-CM | POA: Insufficient documentation

## 2023-05-22 LAB — RESP PANEL BY RT-PCR (RSV, FLU A&B, COVID)  RVPGX2
Influenza A by PCR: NEGATIVE
Influenza B by PCR: NEGATIVE
Resp Syncytial Virus by PCR: NEGATIVE
SARS Coronavirus 2 by RT PCR: NEGATIVE

## 2023-05-22 SURGERY — COLONOSCOPY WITH PROPOFOL
Anesthesia: General

## 2023-05-22 NOTE — Discharge Instructions (Addendum)
Drink plenty of fluids daily.  Return to the ER for worsening symptoms, persistent vomiting, difficulty breathing or other concerns. °

## 2023-05-22 NOTE — ED Provider Notes (Signed)
[facility] Provider Note    Event Date/Time   First MD Initiated Contact with Patient 05/22/23 [phone]     (approximate)   History   Headache and Nausea   HPI  Bruce Woodard is a 37 y.o. male who presents to the ED from work with a chief complaint of generalized malaise.  States he works at [employer] and he had another coworker have been having GI symptoms for several weeks.  Scheduled for EGD/colonoscopy today but claims he never received the prep so he had to cancel his study.  Believes the water at work is causing his symptoms.  Exposed to a coworker with COVID 3 days ago and presents for testing.  Endorses generalized malaise, mild frontal headache, congestion and nausea.  Denies fever/chills, chest pain, shortness of breath, vomiting, diarrhea or dizziness.     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 Authorizing Provider  aluminum-magnesium hydroxide 200-200 MG/5ML suspension Take by mouth every 6 (six) hours as needed for indigestion.    [provider]  diphenhydrAMINE (BENADRYL) 25 mg capsule Take 25 mg by mouth every 6 (six) hours as needed.    [provider]  EPINEPHrine (EPIPEN 2-PAK) 0.3 mg/0.3 mL IJ SOAJ injection Inject 0.3 mg into the muscle as needed for up to 1 dose for anaphylaxis. 04/20/23   Menshew, Charlesetta Ivory, PA-C  famotidine (PEPCID) 20 MG  tablet Take 20 mg by mouth 2 (two) times daily.    [provider]  pantoprazole (PROTONIX) 20 MG tablet Take 40 mg by mouth daily. 03/31/23   [provider]     Allergies  Peanut oil, Penicillins, Shellfish allergy, Shrimp extract, Grass extracts [gramineae pollens], Peanut-containing drug products, Tomato, and Amoxicillin   Family History   Family History  Problem Relation Age of Onset   Hypertension Mother    Rheum arthritis Mother      Physical Exam  Triage Vital Signs: ED Triage Vitals  Encounter Vitals Group     BP 05/22/23 0005 (!) 127/95     Systolic BP Percentile --      Diastolic BP Percentile --      Pulse Rate 05/22/23 0005 76     Resp 05/22/23 0005 20     Temp 05/22/23 0005 98.2 F (36.8 C)     Temp src --      SpO2 05/22/23 0005 100 %     Weight 05/22/23 0007 217 lb (98.4 kg)     Height 05/22/23 0007 5\' 11"  (1.803 m)     Head Circumference --      Peak Flow --      Pain Score 05/22/23 0007 0     Pain Loc --      Pain Education --      Exclude from Growth Chart --  Updated Vital Signs: BP 119/88   Pulse 64   Temp 98.2 F (36.8 C)   Resp 20   Ht 5\' 11"  (1.803 m)   Wt 98.4 kg   SpO2 99%   BMI 30.27 kg/m    General: Awake, no distress.  CV:  RRR.  Good peripheral perfusion.  Resp:  Normal effort.  CTAB. Abd:  Nontender.  No distention.  Other:  PERRL.  EOMI.  Supple neck without meningismus.  No carotid bruits.  Alert and oriented x 3.  CN II-XII grossly intact.  5/5 motor strength and sensation all extremities.   MAE x 4.   ED Results / Procedures / Treatments  Labs (all labs ordered are listed, but only abnormal results are displayed) Labs Reviewed  RESP PANEL BY RT-PCR (RSV, FLU A&B, COVID)  [id number]     EKG  None   RADIOLOGY None   Official radiology report(s): No results found.   PROCEDURES:  Critical Care performed: No  Procedures   MEDICATIONS ORDERED IN ED: Medications - No data to  display   IMPRESSION / MDM / ASSESSMENT AND PLAN / ED COURSE  I reviewed the triage vital signs and the nursing notes.                             37 year old male presenting with generalized malaise and COVID exposure.  Respiratory panel is negative.  Discussed not drinking the water at work but increasing his fluid intake with added electrolytes if he wishes.  Encourage patient to reschedule his GI procedure.  Strict return precautions given.  Patient verbalizes understanding and agrees with plan of care.  Patient's presentation is most consistent with acute, uncomplicated illness.  FINAL CLINICAL IMPRESSION(S) / ED DIAGNOSES   Final diagnoses:  Malaise  Dehydration     Rx / DC Orders   ED Discharge Orders     None        Note:  This document was prepared using Dragon voice recognition software and may include unintentional dictation errors.   Irean Hong, MD 05/22/23 [phone]

## 2023-05-22 NOTE — ED Triage Notes (Signed)
Pt presents to ER with c/o HA, congestion, and nausea that has been going on for a few days.  Pt reports somebody at work tested positive for COVID on 7/28.  Pt denies cough, fever, chills or sob at this time.  Pt is otherwise A&O x4 and in NAD at this time.

## 2023-05-25 ENCOUNTER — Ambulatory Visit
Admission: EM | Admit: 2023-05-25 | Discharge: 2023-05-25 | Disposition: A | Discharge status: Home / Self Care | Payer: 59 | Attending: Emergency Medicine | Admitting: Emergency Medicine

## 2023-05-25 DIAGNOSIS — R1013 Epigastric pain: Secondary | ICD-10-CM

## 2023-05-25 MED ORDER — AZITHROMYCIN 250 MG PO TABS: 250.0000 mg | ORAL_TABLET | Freq: Every day | ORAL | 0 refills | Status: DC

## 2023-05-25 MED ORDER — SUCRALFATE 1 G PO TABS: 1.0000 g | ORAL_TABLET | Freq: Three times a day (TID) | ORAL | 0 refills | Status: DC

## 2023-05-25 NOTE — Discharge Instructions (Signed)
Today you have been evaluated for your abdominal pain, based on the description of your symptoms it is most consistent with stomach irritation  Continue to take Pepcid daily since medicine has been helpful, may also increase the dose to twice a day for more effective coverage  Take Carafate every 6 hours prior to meals, this medicine provides a protective coating over the stomach to prevent ulcerations and protect the stomach from acid associated with GERD (acid reflux)  Due to your various allergies and food intolerance and your persisting stomach pain I do believe that you need to allow your stomach to rest therefore eat the bland his diet tolerable until pain has resolved, avoid spicy, greasy foods, avoid foods that have been overly seasoned, avoid dairy products, avoid high levels of caffeine  You have been prescribed azithromycin which is a antibiotic had a very low suspicion that there is any bacteria that is contributing to your symptoms as they have persisted for 1 month, typically infections cannot persist for this long without making you very ill  Please schedule follow-up appointment with your gastrointestinal doctor  Please reschedule your endoscopy  Please do not drink any more water from your job

## 2023-05-25 NOTE — ED Triage Notes (Signed)
Pt c/o abdominal pain moving across the top of the stomach x2weeks  Pt states that his stomach felt "tender on the inside" last night and his neck and arms are sore.  Pt states that when the pain first started it was in the top left but is now moving across his stomach.  Pt states that he has been drinking unfiltered water at work and a coworker has been having the same symptoms.  Pt is unsure if he has a parasite from the work water.  Pt states that his stomach is not tender to touch, but feels sore inside.

## 2023-05-25 NOTE — ED Provider Notes (Signed)
[facility]    CSN: [id number] Arrival date & time: 05/25/23  1331      History   Chief Complaint Chief Complaint  Patient presents with   Abdominal Pain    HPI Bruce Woodard is a 37 y.o. male.   Patient presents for evaluation of centralized abdominal pain that began 1 month ago after drinking unfiltered water from job.  Was told by his manager that the filter needed to be changed and he is seeing black particles floating within work, coworker has been having similar symptoms.  Abdominal pain has been constant, fluctuating in intensity, initially was present to the center of the abdomen but now radiates across from left to right.  Symptoms exacerbated by consumption of food.  Unsure of any particular trigger but did have a milkshake 1 day ago that caused significant discomfort.  Has multiple food allergies, believes may possibly have a gluten intolerance and dairy intolerance.  Has been evaluated in the emergency department for same symptoms multiple times, has attempted use of a GI cocktail, Prilosec, Nexium which he is deemed ineffective.  Was prescribed Carafate but did not attempted use.  Currently taking Pepcid which has been helpful but does not resolve symptoms.  Denies presence of abdominal bloating, nausea, vomiting or diarrhea.  Endorses constipation but has been having daily bowel movements, has been having to strain to defecate, denies presence of blood.  Followed by GI, had endoscopy scheduled on the first but this was canceled.          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 Authorizing Provider  aluminum-magnesium hydroxide 200-200 MG/5ML suspension Take by mouth every 6 (six) hours as needed for indigestion.   Yes [provider]  diphenhydrAMINE (BENADRYL) 25 mg capsule Take 25 mg by mouth every 6 (six) hours as needed.   Yes [provider]  EPINEPHrine (EPIPEN 2-PAK) 0.3 mg/0.3 mL IJ SOAJ injection Inject 0.3 mg into the muscle as needed for up to 1 dose for anaphylaxis. 04/20/23  Yes Menshew, Charlesetta Ivory, PA-C  famotidine (PEPCID) 20 MG tablet Take 20 mg by mouth 2 (two) times daily.   Yes [provider]  pantoprazole (PROTONIX) 20 MG tablet Take 40 mg by mouth daily. 03/31/23  Yes [provider]    Family History Family History  Problem Relation Age of Onset   Hypertension Mother    Rheum arthritis Mother     Social History Social History   Tobacco Use   Smoking status: Never   Smokeless tobacco: Never  Vaping Use   Vaping status: Never Used  Substance Use Topics   Alcohol use: Yes    Comment: socially   Drug use: No     Allergies   Peanut oil, Penicillins, Shellfish allergy, Shrimp extract, Grass extracts [gramineae pollens], Peanut-containing drug products, Tomato, and Amoxicillin   Review of Systems Review of Systems  Physical Exam Triage Vital Signs ED Triage Vitals  Encounter Vitals Group     BP 05/25/23 1348 121/81     Systolic BP Percentile --      Diastolic BP Percentile --      Pulse Rate 05/25/23 1348 69     Resp --      Temp 05/25/23 1348 98.5 F (36.9 C)     Temp Source 05/25/23 1348 Oral     SpO2 05/25/23 1348 98 %     Weight 05/25/23 1347 218 lb (98.9 kg)     Height 05/25/23 1347 5\' 11"  (1.803 m)     Head Circumference --      Peak Flow --      Pain Score 05/25/23 1347 10     Pain Loc --      Pain Education --      Exclude from Growth Chart  --    No data found.  Updated Vital Signs BP 121/81 (BP Location: Left Arm)   Pulse 69   Temp 98.5 F (36.9 C) (Oral)   Ht 5\' 11"  (1.803 m)   Wt 218 lb (98.9 kg)   SpO2 98%   BMI 30.40 kg/m   Visual Acuity Right Eye Distance:   Left Eye Distance:   Bilateral Distance:    Right Eye Near:   Left Eye Near:    Bilateral Near:     Physical Exam Constitutional:      Appearance: Normal appearance. He is well-developed.  Eyes:     Extraocular Movements: Extraocular movements intact.  Pulmonary:     Effort: Pulmonary effort is normal.  Abdominal:     General: Abdomen is flat. Bowel sounds are increased.     Palpations: Abdomen is soft.     Tenderness: There is no abdominal tenderness.  Neurological:     General: No focal deficit present.     Mental Status: He is alert and oriented to person, place, and time.      UC Treatments / Results  Labs (all labs ordered are listed, but only abnormal results are displayed) Labs Reviewed - No data to display  EKG   Radiology No results found.  Procedures Procedures (including critical care time)  Medications Ordered in UC Medications - No data to display  Initial Impression / Assessment and Plan / UC Course  I have reviewed the triage vital signs and the nursing notes.  Pertinent labs & imaging results that were available during my care of the patient were reviewed by me and considered in my medical decision making (see chart for details).  Epigastric abdominal pain  Vital signs are stable and patient is in no signs of distress nontoxic-appearing, unable to elicit tenderness on exam, bowel sounds are increased, CT of abdomen completed on 05/04/2023 for same symptoms, negative, will defer x-ray imaging today, discussed this with patient, prophylactically providing antibiotic low suspicion for infectious cause due to timeline of symptoms, discussed this with patient, Z-Pak sent to pharmacy, advised increasing dose of Pepcid  from 20 mg daily to twice daily, declined, prescribed Carafate and discussed administration, due to various food intolerances and persisting abdominal pain recommended bland diet and gut rest with avoidance of spicy, greasy foods limited gluten and dairy products, avoidance of caffeine, advise follow-up with GI in 1 to 2 weeks and rescheduling endoscopy, verbalized understanding Final Clinical Impressions(s) / UC Diagnoses   Final diagnoses:  None   Discharge Instructions   None    ED Prescriptions  None    PDMP not reviewed this encounter.   Valinda Hoar, NP 05/25/23 1446

## 2023-06-19 ENCOUNTER — Ambulatory Visit: Payer: 59 | Admitting: Nurse Practitioner

## 2023-06-19 ENCOUNTER — Encounter: Payer: Self-pay | Admitting: Nurse Practitioner

## 2023-06-19 VITALS — BP 124/88 | HR 64 | Temp 97.7°F | Ht 71.0 in | Wt 217.0 lb

## 2023-06-19 DIAGNOSIS — K449 Diaphragmatic hernia without obstruction or gangrene: Secondary | ICD-10-CM

## 2023-06-19 DIAGNOSIS — R1013 Epigastric pain: Secondary | ICD-10-CM

## 2023-06-19 DIAGNOSIS — K219 Gastro-esophageal reflux disease without esophagitis: Secondary | ICD-10-CM | POA: Insufficient documentation

## 2023-06-19 MED ORDER — OMEPRAZOLE 40 MG PO CPDR
40.0000 mg | DELAYED_RELEASE_CAPSULE | Freq: Every day | ORAL | 3 refills | Status: DC
Start: 2023-06-19 — End: 2024-07-11

## 2023-06-19 NOTE — Progress Notes (Signed)
Bethanie Dicker, NP-C Phone: [phone]  Bruce Woodard is a 37 y.o. male who presents today to establish care and for abdominal pain.   Patient has been evaluated multiple times in Urgent Care/ED over the last 2 months for abdominal pain. He had a CT Abd that revealed a small hiatal hernia. He was started on Pepcid and Carafate. Advised follow up with Gastroenterology, bland diet and gut rest. He was scheduled for an endoscopy which was canceled. Notes, imaging and labs all reviewed. He has not scheduled an appointment with GI.   He continues to have intermittent epigastric abdominal pain. He describes it as a sharp, cramping like feeling. Denies nausea and vomiting. Denies dysphagia. He does have increased indigestion and belching. He has been taking over the counter Pepcid with little relief. He has been trying to make dietary changes. Denies diarrhea, constipation or blood in his stool.   Active Ambulatory Problems    Diagnosis Date Noted   Allergy, food 12/21/2019   Laceration of extensor tendon of hand 11/23/2015   Epigastric abdominal pain 06/19/2023   Hiatal hernia with GERD 06/19/2023   Preventative health care 06/26/2023   Resolved Ambulatory Problems    Diagnosis Date Noted   No Resolved Ambulatory Problems   Past Medical History:  Diagnosis Date   Allergy    Asthma    GERD (gastroesophageal reflux disease)     Family History  Problem Relation Age of Onset   Hypertension Mother    Rheum arthritis Mother    Arthritis Mother    Diabetes Father    Depression Maternal Grandmother    Diabetes Maternal Grandmother    Diabetes Sister     Social History   Socioeconomic History   Marital status: Married    Spouse name: Not on file   Number of children: Not on file   Years of education: Not on file   Highest education level: Not on file  Occupational History   Not on file  Tobacco Use   Smoking status: Never   Smokeless tobacco: Never  Vaping Use   Vaping  status: Never Used  Substance and Sexual Activity   Alcohol use: Not Currently    Comment: socially   Drug use: No   Sexual activity: Yes    Birth control/protection: None    Comment: Last encounter: 14th, Unprotected with wife.  Other Topics Concern   Not on file  Social History Narrative   Not on file   Social Determinants of Health   Financial Resource Strain: Not on file  Food Insecurity: Not on file  Transportation Needs: Not on file  Physical Activity: Not on file  Stress: Not on file  Social Connections: Not on file  Intimate Partner Violence: Not on file    ROS  General:  Negative for unexplained weight loss, fever Skin: Negative for new or changing mole, sore that won't heal HEENT: Negative for trouble hearing, trouble seeing, ringing in ears, mouth sores, hoarseness, change in voice, dysphagia. CV:  Negative for chest pain, dyspnea, edema, palpitations Resp: Negative for cough, dyspnea, hemoptysis GI: Negative for nausea, vomiting, diarrhea, constipation, melena, hematochezia. GU: Negative for dysuria, incontinence, urinary hesitance, hematuria, vaginal or penile discharge, polyuria, sexual difficulty, lumps in testicle or breasts MSK: Negative for muscle cramps or aches, joint pain or swelling Neuro: Negative for headaches, weakness, numbness, dizziness, passing out/fainting Psych: Negative for depression, anxiety, memory problems  Objective  Physical Exam Vitals:   06/19/23 0918  BP: 124/88  Pulse:  64  Temp: 97.7 F (36.5 C)  SpO2: 97%    BP Readings from Last 3 Encounters:  06/26/23 122/84  06/19/23 124/88  05/25/23 121/81   Wt Readings from Last 3 Encounters:  06/26/23 221 lb (100.2 kg)  06/19/23 217 lb (98.4 kg)  05/25/23 218 lb (98.9 kg)    Physical Exam Constitutional:      General: He is not in acute distress.    Appearance: Normal appearance.  HENT:     Head: Normocephalic.  Cardiovascular:     Rate and Rhythm: Normal rate and  regular rhythm.     Heart sounds: Normal heart sounds.  Pulmonary:     Effort: Pulmonary effort is normal.     Breath sounds: Normal breath sounds.  Abdominal:     General: Abdomen is flat. Bowel sounds are normal. There is no distension.     Palpations: Abdomen is soft.     Tenderness: There is abdominal tenderness (mild epigastric).  Skin:    General: Skin is warm and dry.  Neurological:     General: No focal deficit present.     Mental Status: He is alert.  Psychiatric:        Mood and Affect: Mood normal.        Behavior: Behavior normal.    Assessment/Plan:   Epigastric abdominal pain Assessment & Plan: ED and Urgent Care notes, labs and imaging reviewed. Encouraged patient to schedule appointment with Gastroenterology for further evaluation.    Hiatal hernia with GERD Assessment & Plan: Will increase Prilosec to 40 mg daily. Encouraged to continue monitoring diet for triggers. Advised to decrease caffeine and alcohol, avoid spicy, greasy and acidic foods. Dietary information provided to patient. Follow up with GI.   Orders: -     Omeprazole; Take 1 capsule (40 mg total) by mouth daily.  Dispense: 90 capsule; Refill: 3    Return for Annual Exam.   Bethanie Dicker, NP-C  [facility] - [facility]

## 2023-06-25 NOTE — Progress Notes (Unsigned)
Bethanie Dicker, NP-C Phone: [phone]  Bruce Woodard is a 37 y.o. male who presents today for annual exam. He has no complaints or new concerns today.   Diet: Trying to improve, increasing fruits and vegetables, decreased fried foods Exercise: None- planning to start going to the gym again Family history-  Prostate cancer: No  Colon cancer: No Sexually active: Yes Vaccines-   Flu: Not due  Tetanus: 11/03/2015  COVID19: Never HIV screening: Today Hep C Screening: Today Tobacco use: No Alcohol use: No Illicit Drug use: No Dentist: Yes Ophthalmology: No  Social History   Tobacco Use  Smoking Status Never  Smokeless Tobacco Never    Current Outpatient Medications on File Prior to Visit  Medication Sig Dispense Refill   omeprazole (PRILOSEC) 40 MG capsule Take 1 capsule (40 mg total) by mouth daily. 90 capsule 3   No current facility-administered medications on file prior to visit.    ROS see history of present illness  Objective  Physical Exam Vitals:   06/26/23 0812  BP: 122/84  Pulse: 61  Temp: 97.8 F (36.6 C)  SpO2: 99%    BP Readings from Last 3 Encounters:  06/26/23 122/84  06/19/23 124/88  05/25/23 121/81   Wt Readings from Last 3 Encounters:  06/26/23 221 lb (100.2 kg)  06/19/23 217 lb (98.4 kg)  05/25/23 218 lb (98.9 kg)    Physical Exam Constitutional:      General: He is not in acute distress.    Appearance: Normal appearance.  HENT:     Head: Normocephalic.     Right Ear: Tympanic membrane normal.     Left Ear: Tympanic membrane normal.     Nose: Nose normal.     Mouth/Throat:     Mouth: Mucous membranes are moist.     Pharynx: Oropharynx is clear.  Eyes:     Conjunctiva/sclera: Conjunctivae normal.     Pupils: Pupils are equal, round, and reactive to light.  Neck:     Thyroid: No thyromegaly.  Cardiovascular:     Rate and Rhythm: Normal rate and regular rhythm.     Heart sounds: Normal heart sounds.  Pulmonary:      Effort: Pulmonary effort is normal.     Breath sounds: Normal breath sounds.  Abdominal:     General: Abdomen is flat. Bowel sounds are normal.     Palpations: Abdomen is soft. There is no mass.     Tenderness: There is no abdominal tenderness.  Musculoskeletal:        General: Normal range of motion.  Lymphadenopathy:     Cervical: No cervical adenopathy.  Skin:    General: Skin is warm and dry.     Findings: No rash.  Neurological:     General: No focal deficit present.     Mental Status: He is alert.  Psychiatric:        Mood and Affect: Mood normal.        Behavior: Behavior normal.    Assessment/Plan: Please see individual problem list.  Preventative health care Assessment & Plan: Physical exam complete. Lab work as outlined. Will contact patient with results. Flu vaccine not due. Tetanus vaccine- UTD. Declined all COVID vaccines. HIV and Hep C screenings today in lab work. Recommended follow up with Dentist and establishing with Ophthalmology for annual exams. Encouraged to continue working on healthy diet and begin exercising. Return to care in one year, sooner PRN.   Orders: -     CBC with  Differential/Platelet -     Comprehensive metabolic panel  Lipid screening -     Lipid panel  Thyroid disorder screen -     TSH  Diabetes mellitus screening -     Hemoglobin A1c  Encounter for hepatitis C screening test for low risk patient -     Hepatitis C antibody  Encounter for screening for HIV -     HIV Antibody (routine testing w rflx)   Return in about 1 year (around 06/25/2024) for Annual Exam, sooner PRN.   Bethanie Dicker, NP-C [facility]

## 2023-06-26 ENCOUNTER — Ambulatory Visit (INDEPENDENT_AMBULATORY_CARE_PROVIDER_SITE_OTHER): Payer: 59 | Admitting: Nurse Practitioner

## 2023-06-26 ENCOUNTER — Encounter: Payer: Self-pay | Admitting: Nurse Practitioner

## 2023-06-26 VITALS — BP 122/84 | HR 61 | Temp 97.8°F | Ht 71.0 in | Wt 221.0 lb

## 2023-06-26 DIAGNOSIS — Z1329 Encounter for screening for other suspected endocrine disorder: Secondary | ICD-10-CM | POA: Diagnosis not present

## 2023-06-26 DIAGNOSIS — Z0000 Encounter for general adult medical examination without abnormal findings: Secondary | ICD-10-CM

## 2023-06-26 DIAGNOSIS — Z131 Encounter for screening for diabetes mellitus: Secondary | ICD-10-CM | POA: Diagnosis not present

## 2023-06-26 DIAGNOSIS — Z114 Encounter for screening for human immunodeficiency virus [HIV]: Secondary | ICD-10-CM | POA: Diagnosis not present

## 2023-06-26 DIAGNOSIS — Z13220 Encounter for screening for lipoid disorders: Secondary | ICD-10-CM | POA: Diagnosis not present

## 2023-06-26 DIAGNOSIS — Z1159 Encounter for screening for other viral diseases: Secondary | ICD-10-CM

## 2023-06-26 HISTORY — DX: Encounter for general adult medical examination without abnormal findings: Z00.00

## 2023-06-26 LAB — LDL CHOLESTEROL, DIRECT: Direct LDL: 223 mg/dL

## 2023-06-26 LAB — CBC WITH DIFFERENTIAL/PLATELET
Basophils Absolute: 0.1 10*3/uL (ref 0.0–0.1)
Basophils Relative: 0.6 % (ref 0.0–3.0)
Eosinophils Absolute: 0.2 10*3/uL (ref 0.0–0.7)
Eosinophils Relative: 2.3 % (ref 0.0–5.0)
HCT: 43.1 % (ref 39.0–52.0)
Hemoglobin: 14.8 g/dL (ref 13.0–17.0)
Lymphocytes Relative: 31.9 % (ref 12.0–46.0)
Lymphs Abs: 2.7 10*3/uL (ref 0.7–4.0)
MCHC: 34.4 g/dL (ref 30.0–36.0)
MCV: 92.7 fl (ref 78.0–100.0)
Monocytes Absolute: 0.6 10*3/uL (ref 0.1–1.0)
Monocytes Relative: 7 % (ref 3.0–12.0)
Neutro Abs: 4.9 10*3/uL (ref 1.4–7.7)
Neutrophils Relative %: 58.2 % (ref 43.0–77.0)
Platelets: 323 10*3/uL (ref 150.0–400.0)
RBC: 4.65 Mil/uL (ref 4.22–5.81)
RDW: 13.1 % (ref 11.5–15.5)
WBC: 8.4 10*3/uL (ref 4.0–10.5)

## 2023-06-26 LAB — COMPREHENSIVE METABOLIC PANEL
ALT: 43 U/L (ref 0–53)
AST: 22 U/L (ref 0–37)
Albumin: 4.4 g/dL (ref 3.5–5.2)
Alkaline Phosphatase: 107 U/L (ref 39–117)
BUN: 11 mg/dL (ref 6–23)
CO2: 29 meq/L (ref 19–32)
Calcium: 10.1 mg/dL (ref 8.4–10.5)
Chloride: 98 meq/L (ref 96–112)
Creatinine, Ser: 1.22 mg/dL (ref 0.40–1.50)
GFR: 76.2 mL/min (ref >60.00)
Glucose, Bld: 100 mg/dL — ABNORMAL HIGH (ref 70–99)
Potassium: 4.2 meq/L (ref 3.5–5.1)
Sodium: 135 meq/L (ref 135–145)
Total Bilirubin: 0.3 mg/dL (ref 0.2–1.2)
Total Protein: 7.5 g/dL (ref 6.0–8.3)

## 2023-06-26 LAB — LIPID PANEL
Cholesterol: 302 mg/dL — ABNORMAL HIGH (ref 0–200)
HDL: 42.8 mg/dL (ref >39.00)
Total CHOL/HDL Ratio: 7
Triglycerides: 415 mg/dL — ABNORMAL HIGH (ref 0.0–149.0)

## 2023-06-26 LAB — TSH: TSH: 2.73 u[IU]/mL (ref 0.35–5.50)

## 2023-06-26 LAB — HEMOGLOBIN A1C: Hgb A1c MFr Bld: 6 % (ref 4.6–6.5)

## 2023-06-26 NOTE — Assessment & Plan Note (Signed)
Physical exam complete. Lab work as outlined. Will contact patient with results. Flu vaccine not due. Tetanus vaccine- UTD. Declined all COVID vaccines. HIV and Hep C screenings today in lab work. Recommended follow up with Dentist and establishing with Ophthalmology for annual exams. Encouraged to continue working on healthy diet and begin exercising. Return to care in one year, sooner PRN.

## 2023-06-27 ENCOUNTER — Telehealth: Payer: Self-pay

## 2023-06-27 ENCOUNTER — Other Ambulatory Visit: Payer: Self-pay | Admitting: Nurse Practitioner

## 2023-06-27 DIAGNOSIS — E782 Mixed hyperlipidemia: Secondary | ICD-10-CM

## 2023-06-27 LAB — HEPATITIS C ANTIBODY: Hepatitis C Ab: NONREACTIVE

## 2023-06-27 LAB — HIV ANTIBODY (ROUTINE TESTING W REFLEX): HIV 1&2 Ab, 4th Generation: NONREACTIVE

## 2023-06-27 MED ORDER — ROSUVASTATIN CALCIUM 10 MG PO TABS
10.0000 mg | ORAL_TABLET | Freq: Every day | ORAL | 3 refills | Status: AC
Start: 2023-06-27 — End: ?

## 2023-06-27 NOTE — Telephone Encounter (Signed)
Patient states he is returning our call.  I transferred call to Donavan Foil, CMA.

## 2023-06-27 NOTE — Telephone Encounter (Signed)
LMOM For pt to CB in regards to labs

## 2023-06-27 NOTE — Telephone Encounter (Signed)
Spoke with pt and info has been documented in lab tab

## 2023-06-30 ENCOUNTER — Telehealth: Payer: Self-pay

## 2023-06-30 NOTE — Telephone Encounter (Signed)
Detailed VM left informed pt about the medication provider sent in and the side effects that are most common:    Bruce Dicker, NP  Donavan Foil, CMA Crestor. Muscles aches are usually the most common side effect.

## 2023-07-01 ENCOUNTER — Encounter: Payer: Self-pay | Admitting: Emergency Medicine

## 2023-07-01 ENCOUNTER — Other Ambulatory Visit: Payer: Self-pay

## 2023-07-01 ENCOUNTER — Emergency Department: Payer: 59

## 2023-07-01 DIAGNOSIS — R079 Chest pain, unspecified: Secondary | ICD-10-CM | POA: Diagnosis not present

## 2023-07-01 DIAGNOSIS — R0789 Other chest pain: Secondary | ICD-10-CM | POA: Insufficient documentation

## 2023-07-01 LAB — CBC
HCT: 41.8 % (ref 39.0–52.0)
Hemoglobin: 14.7 g/dL (ref 13.0–17.0)
MCH: 31.3 pg (ref 26.0–34.0)
MCHC: 35.2 g/dL (ref 30.0–36.0)
MCV: 88.9 fL (ref 80.0–100.0)
Platelets: 343 10*3/uL (ref 150–400)
RBC: 4.7 MIL/uL (ref 4.22–5.81)
RDW: 12 % (ref 11.5–15.5)
WBC: 7.7 10*3/uL (ref 4.0–10.5)
nRBC: 0 % (ref 0.0–0.2)

## 2023-07-01 LAB — BASIC METABOLIC PANEL
Anion gap: 9 (ref 5–15)
BUN: 18 mg/dL (ref 6–20)
CO2: 27 mmol/L (ref 22–32)
Calcium: 9.7 mg/dL (ref 8.9–10.3)
Chloride: 100 mmol/L (ref 98–111)
Creatinine, Ser: 1.19 mg/dL (ref 0.61–1.24)
GFR, Estimated: >60 mL/min (ref >60)
Glucose, Bld: 112 mg/dL — ABNORMAL HIGH (ref 70–99)
Potassium: 3.8 mmol/L (ref 3.5–5.1)
Sodium: 136 mmol/L (ref 135–145)

## 2023-07-01 LAB — TROPONIN I (HIGH SENSITIVITY): Troponin I (High Sensitivity): 4 ng/L (ref <18)

## 2023-07-01 NOTE — Assessment & Plan Note (Signed)
Will increase Prilosec to 40 mg daily. Encouraged to continue monitoring diet for triggers. Advised to decrease caffeine and alcohol, avoid spicy, greasy and acidic foods. Dietary information provided to patient. Follow up with GI.

## 2023-07-01 NOTE — Assessment & Plan Note (Addendum)
ED and Urgent Care notes, labs and imaging reviewed. Encouraged patient to schedule appointment with Gastroenterology for further evaluation.

## 2023-07-01 NOTE — ED Triage Notes (Signed)
Pt arrived via POV with reports of chest pain states he has been having chest pain, but while he was at work the pain was got worse. Pt has  been to PCP dx with high cholesterol.  Pt describes the pain is worse when he bends forwards and is in the center of his chest.  Pt states he had chest pain this morning that woke him from his sleep.

## 2023-07-02 ENCOUNTER — Telehealth: Payer: Self-pay

## 2023-07-02 ENCOUNTER — Emergency Department
Admission: EM | Admit: 2023-07-02 | Discharge: 2023-07-02 | Disposition: A | Discharge status: Home / Self Care | Payer: 59 | Attending: Emergency Medicine | Admitting: Emergency Medicine

## 2023-07-02 DIAGNOSIS — R0789 Other chest pain: Secondary | ICD-10-CM

## 2023-07-02 MED ORDER — KETOROLAC TROMETHAMINE 30 MG/ML IJ SOLN
15.0000 mg | Freq: Once | INTRAMUSCULAR | Status: DC
  Filled 2023-07-02: qty 1

## 2023-07-02 NOTE — Telephone Encounter (Signed)
Patient states he went to the ED last night and they think the sack around his heart may be inflamed.  Patient states he would like to know what Bethanie Dicker, NP, recommends that he do.  I have scheduled patient for an appointment with Bethanie Dicker, NP, on 07/04/2023.  Patient states they tried to give him Torodal at the ED, but patient states he refused to take it because he was unsure what it would do.  Patient states he is taking ibuprofen right now but he does not think it's strong enough.  Patient states when he bends his neck his chest hurts.  Patient states he was having these same symptoms when he went to the ED last night.  Patient states the ED ran tests and determined that the sack around his heart may be inflamed.

## 2023-07-02 NOTE — ED Notes (Signed)
Patient left saying he was skeptical about getting medication, and being stuck with another needle.  He was given his discharge paperwork with a referral to cardiology as he requested.

## 2023-07-02 NOTE — ED Notes (Signed)
Patient refused IV stick or redraw of troponin until after he has spoken with a physician.  Dr. Katrinka Blazing has been advised.

## 2023-07-02 NOTE — ED Provider Notes (Signed)
[facility] Provider Note    Event Date/Time   First MD Initiated Contact with Patient 07/02/23 0108     (approximate)   History   Chest Pain   HPI  Bruce Woodard is a 37 y.o. male who presents to the ED for evaluation of Chest Pain   I review a PCP visit from 1 week ago.  Annual exam without acute complaints.  Screening lipid panel with HLD.  Normal A1c  Patient presents for evaluation of 3 months of intermittent chest pain.  Reports pain worse at night or when he leans forward.  Reports he recently started working out and with exercise, jumping rope, weightlifting he denies any chest discomfort, dyspnea or decreased tolerance of exercise.  Similar quality pain tonight and he wanted to get checked out because he was worried about his heart.   Physical Exam   Triage Vital Signs: ED Triage Vitals [07/01/23 2310]  Encounter Vitals Group     BP 130/86     Systolic BP Percentile      Diastolic BP Percentile      Pulse Rate 81     Resp 16     Temp 97.8 F (36.6 C)     Temp Source Oral     SpO2 96 %     Weight 221 lb (100.2 kg)     Height 5\' 11"  (1.803 m)     Head Circumference      Peak Flow      Pain Score 10     Pain Loc      Pain Education      Exclude from Growth Chart     Most recent vital signs: Vitals:   07/01/23 2310 07/02/23 0108  BP: 130/86 130/85  Pulse: 81 60  Resp: 16 12  Temp: 97.8 F (36.6 C)   SpO2: 96% 100%    General: Awake, no distress.  CV:  Good peripheral perfusion.  RRR without appreciable murmur Resp:  Normal effort.  Abd:  No distention.  MSK:  No deformity noted.  Neuro:  No focal deficits appreciated. Other:     ED Results / Procedures / Treatments   Labs (all labs ordered are listed, but only abnormal results are displayed) Labs Reviewed  BASIC METABOLIC PANEL - Abnormal; Notable for the following components:      Result Value   Glucose, Bld 112 (*)    All other components within normal  limits  CBC  TROPONIN I (HIGH SENSITIVITY)  TROPONIN I (HIGH SENSITIVITY)    EKG Sinus rhythm with a rate of 74 bpm.  Normal axis and intervals.  Fairly diffuse upsloping ST elevations without STEMI.  Concern for benign early repolarization versus possible pericarditis  Comparison from 7/1 with similar upsloping elevations  RADIOLOGY CXR interpreted by me without evidence of acute cardiopulmonary pathology.  Official radiology report(s): DG Chest 2 View  Result Date: 07/01/2023 CLINICAL DATA:  Mid chest pain. EXAM: CHEST - 2 VIEW COMPARISON:  April 20, 2023 FINDINGS: The heart size and mediastinal contours are within normal limits. Both lungs are clear. The visualized skeletal structures are unremarkable. IMPRESSION: No active cardiopulmonary disease. Electronically Signed   By: Aram Candela M.D.   On: 07/01/2023 23:39    PROCEDURES and INTERVENTIONS:  .1-3 Lead EKG Interpretation  Performed by: Delton Prairie, MD Authorized by: Delton Prairie, MD     Interpretation: normal     ECG rate:  62   ECG rate assessment: normal  Rhythm: sinus rhythm     Ectopy: none     Conduction: normal     Medications  ketorolac (TORADOL) 30 MG/ML injection 15 mg (has no administration in time range)     IMPRESSION / MDM / ASSESSMENT AND PLAN / ED COURSE  I reviewed the triage vital signs and the nursing notes.  Differential diagnosis includes, but is not limited to, ACS, PTX, PNA, muscle strain/spasm, PE, dissection, anxiety, pleural effusion  {Patient presents with symptoms of an acute illness or injury that is potentially life-threatening.  Patient presents with chronic intermittent chest discomfort with a benign workup and suitable for outpatient management.  Look systemically well with a normal exam, normal vitals.  EKG with fairly diffuse upsloping elevation consistent with benign early repolarization.  Pericarditis is a possibility with the positional nature of his symptoms.  I  discussed this with the patient and recommended IV Toradol and he initially is agreeable, but apparently gets anxious and ultimately refuses after multiple subsequent conversations.  Also refusing a second troponin despite my recommendation to do so.  Normal first troponin, metabolic panel and CBC.  While I considered observation admission, outpatient management with cardiology referral seems reasonable.  We discussed NSAIDs at home return precautions and he is suitable for outpatient management  Clinical Course as of 07/02/23 0155  Wed Jul 02, 2023  0148 Again reassessed the patient and had a thorough conversation about plan of care.  He is "really nervous" about a second troponin, IV and a shot of Toradol.  We again discussed NSAIDs, safety profile and analogous OTC medications.  He says that he does not know and keeps pulling up his phone to apparently look at [employer].  After discussing risk and benefit he decided to go ahead and discharge and refer to cardiology.  We discussed return precautions. [DS]    Clinical Course User Index [DS] Delton Prairie, MD     FINAL CLINICAL IMPRESSION(S) / ED DIAGNOSES   Final diagnoses:  Other chest pain     Rx / DC Orders   ED Discharge Orders          Ordered    Ambulatory referral to Cardiology       Comments: Positional chest pain, upsloping ST elevations on EKG.  Pericarditis versus benign earlier polarization  If you have not heard from the Cardiology office within the next 72 hours please call [phone].   07/02/23 0149             Note:  This document was prepared using Dragon voice recognition software and may include unintentional dictation errors.   Delton Prairie, MD 07/02/23 [phone]

## 2023-07-02 NOTE — Discharge Instructions (Signed)
Please take Tylenol and ibuprofen/Advil for your pain.  It is safe to take them together, or to alternate them every few hours.  Take up to 1000mg of Tylenol at a time, up to 4 times per day.  Do not take more than 4000 mg of Tylenol in 24 hours.  For ibuprofen, take 400-600 mg, 3 - 4 times per day.  

## 2023-07-04 ENCOUNTER — Ambulatory Visit: Payer: 59 | Admitting: Nurse Practitioner

## 2023-07-29 DIAGNOSIS — R079 Chest pain, unspecified: Secondary | ICD-10-CM | POA: Diagnosis not present

## 2023-07-31 ENCOUNTER — Telehealth: Payer: Self-pay

## 2023-07-31 DIAGNOSIS — K219 Gastro-esophageal reflux disease without esophagitis: Secondary | ICD-10-CM | POA: Diagnosis not present

## 2023-07-31 DIAGNOSIS — I440 Atrioventricular block, first degree: Secondary | ICD-10-CM | POA: Diagnosis not present

## 2023-07-31 DIAGNOSIS — R079 Chest pain, unspecified: Secondary | ICD-10-CM | POA: Diagnosis not present

## 2023-07-31 NOTE — Telephone Encounter (Signed)
Transition Care Management Unsuccessful Follow-up Telephone Call  Date of discharge and from where:  07/02/2023 [facility]  Attempts:  1st Attempt  Reason for unsuccessful TCM follow-up call:  Left voice message  Tyrone Pautsch [facility]  [facility], [facility] Guide Direct Dial: [phone]  Website: [email or web address]

## 2023-08-01 ENCOUNTER — Telehealth: Payer: Self-pay

## 2023-08-01 NOTE — Telephone Encounter (Signed)
Transition Care Management Unsuccessful Follow-up Telephone Call  Date of discharge and from where:  07/02/2023 [facility]  Attempts:  2nd Attempt  Reason for unsuccessful TCM follow-up call:  No answer/busy  Shiri Hodapp [facility]  [facility], [facility] Guide Direct Dial: [phone]  Website: [email or web address]

## 2023-08-02 DIAGNOSIS — K2100 Gastro-esophageal reflux disease with esophagitis, without bleeding: Secondary | ICD-10-CM | POA: Diagnosis not present

## 2023-08-12 ENCOUNTER — Telehealth: Payer: Self-pay | Admitting: Nurse Practitioner

## 2023-08-12 NOTE — Telephone Encounter (Signed)
Patient called and states was in Hospital and hospital told him to ask provider to give referral for H Pylori test on [facility]. Please call patient for further information

## 2023-08-13 ENCOUNTER — Telehealth: Payer: Self-pay | Admitting: Gastroenterology

## 2023-08-13 NOTE — Telephone Encounter (Signed)
Patient called in stating that he do to want schedule the EGD with Dr. Servando Snare. He said that Dr. Servando Snare informed him that's the only way he will need what's wrong. The patient just want the EGD he no longer wants the office visit.

## 2023-08-14 NOTE — Telephone Encounter (Signed)
Pt stated that he was at work and requested for me to call him back in the AM

## 2023-08-15 ENCOUNTER — Other Ambulatory Visit: Payer: Self-pay | Admitting: Nurse Practitioner

## 2023-08-15 ENCOUNTER — Other Ambulatory Visit: Payer: Self-pay

## 2023-08-15 DIAGNOSIS — K625 Hemorrhage of anus and rectum: Secondary | ICD-10-CM

## 2023-08-15 DIAGNOSIS — R1013 Epigastric pain: Secondary | ICD-10-CM

## 2023-08-15 DIAGNOSIS — R1012 Left upper quadrant pain: Secondary | ICD-10-CM

## 2023-08-15 DIAGNOSIS — R6881 Early satiety: Secondary | ICD-10-CM

## 2023-08-15 DIAGNOSIS — K219 Gastro-esophageal reflux disease without esophagitis: Secondary | ICD-10-CM

## 2023-08-15 DIAGNOSIS — R634 Abnormal weight loss: Secondary | ICD-10-CM

## 2023-08-27 DIAGNOSIS — K219 Gastro-esophageal reflux disease without esophagitis: Secondary | ICD-10-CM | POA: Diagnosis not present

## 2023-08-27 DIAGNOSIS — R1013 Epigastric pain: Secondary | ICD-10-CM | POA: Diagnosis not present

## 2023-08-29 LAB — H. PYLORI ANTIGEN, STOOL: H pylori Ag, Stl: NEGATIVE

## 2023-09-01 DIAGNOSIS — R079 Chest pain, unspecified: Secondary | ICD-10-CM | POA: Insufficient documentation

## 2023-09-01 NOTE — Progress Notes (Deleted)
NO SHOW

## 2023-09-02 ENCOUNTER — Ambulatory Visit: Payer: 59 | Attending: Cardiovascular Disease | Admitting: Cardiovascular Disease

## 2023-09-02 DIAGNOSIS — R079 Chest pain, unspecified: Secondary | ICD-10-CM

## 2023-09-03 ENCOUNTER — Ambulatory Visit: Payer: 59 | Admitting: Nurse Practitioner

## 2023-09-03 ENCOUNTER — Encounter: Payer: Self-pay | Admitting: Nurse Practitioner

## 2023-09-03 VITALS — BP 110/78 | HR 63 | Temp 97.9°F | Ht 71.0 in | Wt 203.8 lb

## 2023-09-03 DIAGNOSIS — K449 Diaphragmatic hernia without obstruction or gangrene: Secondary | ICD-10-CM

## 2023-09-03 DIAGNOSIS — K219 Gastro-esophageal reflux disease without esophagitis: Secondary | ICD-10-CM

## 2023-09-03 DIAGNOSIS — R1013 Epigastric pain: Secondary | ICD-10-CM | POA: Diagnosis not present

## 2023-09-03 NOTE — Progress Notes (Signed)
Bethanie Dicker, NP-C Phone: [phone]  Bruce Woodard is a 37 y.o. male who presents today for abdominal pain.   Discussed the use of AI scribe software for clinical note transcription with the patient, who gave verbal consent to proceed.  History of Present Illness   The patient presents with ongoing upper abdominal pain, specifically in the epigastric area, which has been persisting for several months. The discomfort is described as non-acidic in nature and is exacerbated by certain movements. The onset of symptoms was noted after consuming sausage during a trip to [place] and potentially drinking contaminated water at work. The patient notes a feeling of increased gas and needing to belch. He denies any heartburn or reflux. The patient denies any changes in bowel habits. Denies blood in his stool. Denies nausea, vomiting or fevers.   The patient has sought medical attention multiple times for this issue, including visits to the emergency department and urgent care. A CT scan of the abdomen and pelvis revealed a hiatal hernia, but otherwise normal findings. The patient has been prescribed Carafate and Pepcid for suspected peptic ulcer disease, but questions the absence of antibiotic treatment.  The patient reports that the pain seems to worsen when not eating and has resulted in a significant weight loss of nearly twenty pounds over the last two months. The patient has been adhering to a bland diet, avoiding heavy foods and focusing on consuming soup, fruit, and grilled chicken.   The patient has also expressed interest in exploring potential food allergies, noting a recent self-imposed avoidance of cheese due to associated stomach discomfort. The patient has started taking a probiotic supplement in an attempt to promote gut health.      Social History   Tobacco Use  Smoking Status Never  Smokeless Tobacco Never    Current Outpatient Medications on File Prior to Visit  Medication  Sig Dispense Refill   omeprazole (PRILOSEC) 40 MG capsule Take 1 capsule (40 mg total) by mouth daily. 90 capsule 3   rosuvastatin (CRESTOR) 10 MG tablet Take 1 tablet (10 mg total) by mouth daily. (Patient not taking: Reported on 09/03/2023) 90 tablet 3   No current facility-administered medications on file prior to visit.    ROS see history of present illness  Objective  Physical Exam Vitals:   09/03/23 0856  BP: 110/78  Pulse: 63  Temp: 97.9 F (36.6 C)  SpO2: 99%    BP Readings from Last 3 Encounters:  09/03/23 110/78  07/02/23 126/80  06/26/23 122/84   Wt Readings from Last 3 Encounters:  09/03/23 203 lb 12.8 oz (92.4 kg)  07/01/23 221 lb (100.2 kg)  06/26/23 221 lb (100.2 kg)    Physical Exam Constitutional:      General: He is not in acute distress.    Appearance: Normal appearance.  HENT:     Head: Normocephalic.  Cardiovascular:     Rate and Rhythm: Normal rate and regular rhythm.     Heart sounds: Normal heart sounds.  Pulmonary:     Effort: Pulmonary effort is normal.     Breath sounds: Normal breath sounds.  Abdominal:     General: Abdomen is flat. Bowel sounds are normal.     Palpations: Abdomen is soft.     Tenderness: There is no abdominal tenderness. There is no guarding.  Skin:    General: Skin is warm and dry.  Neurological:     General: No focal deficit present.     Mental Status: He  is alert.  Psychiatric:        Mood and Affect: Mood normal.        Behavior: Behavior normal.    Assessment/Plan: Please see individual problem list.  Epigastric abdominal pain Assessment & Plan: They have chronic, non-specific epigastric pain, with a history of multiple ER visits and normal CT abdomen/pelvis scans. A hiatal hernia was noted on imaging. Their pain worsens with fasting and may be associated with certain foods, alongside noted weight loss, possibly secondary to decreased intake due to pain. He has an endoscopy and colonoscopy scheduled on  09/25/2023 for further evaluation. We will order a food allergy panel to assess for potential food allergies, and continue omeprazole and Pepcid for acid suppression. They are advised to maintain a bland diet, monitor symptoms, and consider probiotics for gut health.  Orders: -     Food Allergy Profile  Hiatal hernia with GERD Assessment & Plan: Identified on CT abdomen/pelvis, the hiatal hernia may be contributing to their epigastric pain and acid reflux symptoms. An endoscopy is scheduled for 09/25/2023 to further evaluate this condition. Omeprazole and Pepcid will be continued for acid suppression.     Return if symptoms worsen or fail to improve.   Bethanie Dicker, NP-C [facility] - [facility]

## 2023-09-03 NOTE — Assessment & Plan Note (Signed)
Identified on CT abdomen/pelvis, the hiatal hernia may be contributing to their epigastric pain and acid reflux symptoms. An endoscopy is scheduled for 09/25/2023 to further evaluate this condition. Omeprazole and Pepcid will be continued for acid suppression.

## 2023-09-03 NOTE — Assessment & Plan Note (Signed)
They have chronic, non-specific epigastric pain, with a history of multiple ER visits and normal CT abdomen/pelvis scans. A hiatal hernia was noted on imaging. Their pain worsens with fasting and may be associated with certain foods, alongside noted weight loss, possibly secondary to decreased intake due to pain. He has an endoscopy and colonoscopy scheduled on 09/25/2023 for further evaluation. We will order a food allergy panel to assess for potential food allergies, and continue omeprazole and Pepcid for acid suppression. They are advised to maintain a bland diet, monitor symptoms, and consider probiotics for gut health.

## 2023-09-04 LAB — FOOD ALLERGY PROFILE
Allergen, Salmon, f41: 0.11 kU/L — ABNORMAL HIGH
Almonds: 9.03 kU/L — ABNORMAL HIGH
CLASS: 0
CLASS: 3
CLASS: 3
CLASS: 3
CLASS: 3
CLASS: 3
CLASS: 3
CLASS: 3
CLASS: 3
Cashew IgE: 6.52 kU/L — ABNORMAL HIGH
Class: 0
Class: 0
Class: 2
Class: 4
Egg White IgE: <0.1 kU/L
Fish Cod: 0.17 kU/L — ABNORMAL HIGH
Hazelnut: 8.3 kU/L — ABNORMAL HIGH
Milk IgE: <0.1 kU/L
Peanut IgE: 17.6 kU/L — ABNORMAL HIGH
Scallop IgE: 5.31 kU/L — ABNORMAL HIGH
Sesame Seed f10: 14.5 kU/L — ABNORMAL HIGH
Shrimp IgE: 2.46 kU/L — ABNORMAL HIGH
Soybean IgE: 10.2 kU/L — ABNORMAL HIGH
Tuna IgE: <0.1 kU/L
Walnut: 9.2 kU/L — ABNORMAL HIGH
Wheat IgE: 14.1 kU/L — ABNORMAL HIGH

## 2023-09-04 LAB — INTERPRETATION:

## 2023-09-16 ENCOUNTER — Encounter: Payer: Self-pay | Admitting: Gastroenterology

## 2023-09-18 DIAGNOSIS — Z79899 Other long term (current) drug therapy: Secondary | ICD-10-CM | POA: Diagnosis not present

## 2023-09-18 DIAGNOSIS — R634 Abnormal weight loss: Secondary | ICD-10-CM | POA: Diagnosis not present

## 2023-09-18 DIAGNOSIS — Z91013 Allergy to seafood: Secondary | ICD-10-CM | POA: Diagnosis not present

## 2023-09-18 DIAGNOSIS — Z91010 Allergy to peanuts: Secondary | ICD-10-CM | POA: Diagnosis not present

## 2023-09-18 DIAGNOSIS — Z880 Allergy status to penicillin: Secondary | ICD-10-CM | POA: Diagnosis not present

## 2023-09-18 DIAGNOSIS — R109 Unspecified abdominal pain: Secondary | ICD-10-CM | POA: Diagnosis not present

## 2023-09-18 DIAGNOSIS — R140 Abdominal distension (gaseous): Secondary | ICD-10-CM | POA: Diagnosis not present

## 2023-09-18 DIAGNOSIS — K449 Diaphragmatic hernia without obstruction or gangrene: Secondary | ICD-10-CM | POA: Diagnosis not present

## 2023-09-18 DIAGNOSIS — R1012 Left upper quadrant pain: Secondary | ICD-10-CM | POA: Diagnosis not present

## 2023-09-18 DIAGNOSIS — R142 Eructation: Secondary | ICD-10-CM | POA: Diagnosis not present

## 2023-09-22 ENCOUNTER — Telehealth: Payer: Self-pay | Admitting: Gastroenterology

## 2023-09-22 DIAGNOSIS — R634 Abnormal weight loss: Secondary | ICD-10-CM

## 2023-09-22 NOTE — Telephone Encounter (Signed)
Spoke to pt about procedures and went over the instructions... Nothing further needed at this time

## 2023-09-22 NOTE — Telephone Encounter (Signed)
Patient called in because he has a procedure on 09/25/2023 and he has some question for the nurse.

## 2023-09-23 NOTE — Telephone Encounter (Signed)
Pt had additional questions regarding prep and food, nothing further needed.. I went over instructions

## 2023-09-23 NOTE — Telephone Encounter (Signed)
Patient called in because he would like to have a different Prep. Patient is waiting for a call back.

## 2023-09-25 ENCOUNTER — Ambulatory Visit: Payer: 59 | Admitting: General Practice

## 2023-09-25 ENCOUNTER — Ambulatory Visit
Admission: RE | Admit: 2023-09-25 | Discharge: 2023-09-25 | Disposition: A | Discharge status: Home / Self Care | Payer: 59 | Attending: Gastroenterology | Admitting: Gastroenterology

## 2023-09-25 ENCOUNTER — Encounter
Admission: RE | Disposition: A | Discharge status: Home / Self Care | Payer: Self-pay | Source: Home / Self Care | Attending: Gastroenterology

## 2023-09-25 ENCOUNTER — Encounter: Payer: Self-pay | Admitting: Gastroenterology

## 2023-09-25 DIAGNOSIS — R634 Abnormal weight loss: Secondary | ICD-10-CM | POA: Diagnosis not present

## 2023-09-25 DIAGNOSIS — K219 Gastro-esophageal reflux disease without esophagitis: Secondary | ICD-10-CM | POA: Insufficient documentation

## 2023-09-25 DIAGNOSIS — R1012 Left upper quadrant pain: Secondary | ICD-10-CM

## 2023-09-25 DIAGNOSIS — K625 Hemorrhage of anus and rectum: Secondary | ICD-10-CM

## 2023-09-25 DIAGNOSIS — R6881 Early satiety: Secondary | ICD-10-CM

## 2023-09-25 DIAGNOSIS — R1013 Epigastric pain: Secondary | ICD-10-CM | POA: Diagnosis not present

## 2023-09-25 HISTORY — PX: ESOPHAGOGASTRODUODENOSCOPY (EGD) WITH PROPOFOL: SHX5813

## 2023-09-25 HISTORY — PX: COLONOSCOPY WITH PROPOFOL: SHX5780

## 2023-09-25 HISTORY — PX: BIOPSY: SHX5522

## 2023-09-25 SURGERY — COLONOSCOPY WITH PROPOFOL
Anesthesia: General

## 2023-09-25 MED ORDER — MIDAZOLAM HCL 2 MG/2ML IJ SOLN
INTRAMUSCULAR | Status: AC
  Filled 2023-09-25: qty 2

## 2023-09-25 MED ORDER — MIDAZOLAM HCL 5 MG/5ML IJ SOLN
INTRAMUSCULAR | Status: DC | PRN
  Administered 2023-09-25: 2 mg via INTRAVENOUS

## 2023-09-25 MED ORDER — SODIUM CHLORIDE 0.9 % IV SOLN: INTRAVENOUS | Status: DC

## 2023-09-25 MED ORDER — PROPOFOL 500 MG/50ML IV EMUL
INTRAVENOUS | Status: DC | PRN
  Administered 2023-09-25: 140 ug/kg/min via INTRAVENOUS

## 2023-09-25 MED ORDER — PROPOFOL 10 MG/ML IV BOLUS
INTRAVENOUS | Status: DC | PRN
  Administered 2023-09-25: 100 mg via INTRAVENOUS

## 2023-09-25 MED ORDER — DEXMEDETOMIDINE HCL IN NACL 80 MCG/20ML IV SOLN
INTRAVENOUS | Status: DC | PRN
  Administered 2023-09-25: 8 ug via INTRAVENOUS

## 2023-09-25 MED ORDER — LIDOCAINE HCL (CARDIAC) PF 100 MG/5ML IV SOSY
PREFILLED_SYRINGE | INTRAVENOUS | Status: DC | PRN
  Administered 2023-09-25: 100 mg via INTRAVENOUS

## 2023-09-25 NOTE — Transfer of Care (Signed)
Immediate Anesthesia Transfer of Care Note  Patient: Bruce Woodard  Procedure(s) Performed: COLONOSCOPY WITH PROPOFOL ESOPHAGOGASTRODUODENOSCOPY (EGD) WITH PROPOFOL  Patient Location: PACU  Anesthesia Type:General  Level of Consciousness: awake, alert , and oriented  Airway & Oxygen Therapy: Patient Spontanous Breathing  Post-op Assessment: Report given to RN and Post -op Vital signs reviewed and stable  Post vital signs: Reviewed and stable  Last Vitals:  Vitals Value Taken Time  BP    Temp    Pulse 94 09/25/23 1125  Resp 19 09/25/23 1125  SpO2 97 % 09/25/23 1125  Vitals shown include unfiled device data.  Last Pain:  Vitals:   09/25/23 1042  TempSrc: Temporal         Complications: No notable events documented.

## 2023-09-25 NOTE — Anesthesia Preprocedure Evaluation (Signed)
Anesthesia Evaluation  Patient identified by MRN, date of birth, ID band Patient awake    Reviewed: Allergy & Precautions, NPO status , Patient's Chart, lab work & pertinent test results  Airway Mallampati: III  TM Distance: >3 FB Neck ROM: full    Dental  (+) Chipped, Dental Advidsory Given   Pulmonary neg pulmonary ROS   Pulmonary exam normal        Cardiovascular negative cardio ROS Normal cardiovascular exam     Neuro/Psych negative neurological ROS  negative psych ROS   GI/Hepatic Neg liver ROS,GERD  Medicated,,  Endo/Other  negative endocrine ROS    Renal/GU negative Renal ROS  negative genitourinary   Musculoskeletal   Abdominal   Peds  Hematology negative hematology ROS (+)   Anesthesia Other Findings Past Medical History: No date: Allergy No date: Asthma     Comment:  as a child No date: GERD (gastroesophageal reflux disease) 06/26/2023: Preventative health care  Past Surgical History: 11/14/2015: REPAIR EXTENSOR TENDON; Right     Comment:  Procedure: REPAIR EXTENSOR TENDON, 3rd and 5th;                Surgeon: Christena Flake, MD;  Location: [facility] ORS;  Service:              Orthopedics;  Laterality: Right;  BMI    Body Mass Index: 27.98 kg/m      Reproductive/Obstetrics negative OB ROS                             Anesthesia Physical Anesthesia Plan  ASA: 2  Anesthesia Plan: General   Post-op Pain Management: Minimal or no pain anticipated   Induction: Intravenous  PONV Risk Score and Plan: 3 and Propofol infusion, TIVA and Ondansetron  Airway Management Planned: Nasal Cannula  Additional Equipment: None  Intra-op Plan:   Post-operative Plan:   Informed Consent: I have reviewed the patients History and Physical, chart, labs and discussed the procedure including the risks, benefits and alternatives for the proposed anesthesia with the patient or authorized  representative who has indicated his/her understanding and acceptance.     Dental advisory given  Plan Discussed with: CRNA and Surgeon  Anesthesia Plan Comments: (Discussed risks of anesthesia with patient, including possibility of difficulty with spontaneous ventilation under anesthesia necessitating airway intervention, PONV, and rare risks such as cardiac or respiratory or neurological events, and allergic reactions. Discussed the role of CRNA in patient's perioperative care. Patient understands.)       Anesthesia Quick Evaluation

## 2023-09-25 NOTE — Op Note (Signed)
[facility] Gastroenterology Patient Name: Zakye Arnall Procedure Date: 09/25/2023 10:54 AM MRN: [id number] Account #: [id number] Date of Birth: 04-19-1986 Admit Type: Outpatient Age: 37 Room: [facility] ENDO ROOM 4 Gender: Male Note Status: Finalized Instrument Name: Nelda Marseille [id number] Procedure:             Colonoscopy Indications:           Weight loss Providers:             Midge Minium MD, MD Referring MD:          Bethanie Dicker (Referring MD) Medicines:             Propofol per Anesthesia Complications:         No immediate complications. Procedure:             Pre-Anesthesia Assessment:                        - Prior to the procedure, a History and Physical was                         performed, and patient medications and allergies were                         reviewed. The patient's tolerance of previous                         anesthesia was also reviewed. The risks and benefits                         of the procedure and the sedation options and risks                         were discussed with the patient. All questions were                         answered, and informed consent was obtained. Prior                         Anticoagulants: The patient has taken no anticoagulant                         or antiplatelet agents. ASA Grade Assessment: II - A                         patient with mild systemic disease. After reviewing                         the risks and benefits, the patient was deemed in                         satisfactory condition to undergo the procedure.                        After obtaining informed consent, the colonoscope was                         passed under direct vision. Throughout the procedure,  the patient's blood pressure, pulse, and oxygen                         saturations were monitored continuously. The                         Colonoscope was introduced through the anus and                          advanced to the the terminal ileum. The colonoscopy                         was performed without difficulty. The patient                         tolerated the procedure well. The quality of the bowel                         preparation was excellent. Findings:      The perianal and digital rectal examinations were normal.      The colon (entire examined portion) appeared normal.      The terminal ileum appeared normal. Biopsies were taken with a cold       forceps for histology. Impression:            - The entire examined colon is normal.                        - The examined portion of the ileum was normal.                         Biopsied. Recommendation:        - Discharge patient to home.                        - Resume previous diet.                        - Continue present medications.                        - Await pathology results.                        - Repeat colonoscopy in 10 years for screening                         purposes. Procedure Code(s):     --- Professional ---                        [phone], Colonoscopy, flexible; with biopsy, single or                         multiple Diagnosis Code(s):     --- Professional ---                        R63.4, Abnormal weight loss CPT copyright 2022 American Medical Association. All rights reserved. The codes documented in this report are preliminary and upon coder review may  be revised to meet current compliance requirements. Midge Minium MD,  MD 09/25/2023 11:24:24 AM This report has been signed electronically. Number of Addenda: 0 Note Initiated On: 09/25/2023 10:54 AM Scope Withdrawal Time: 0 hours 5 minutes 55 seconds  Total Procedure Duration: 0 hours 7 minutes 47 seconds  Estimated Blood Loss:  Estimated blood loss: none.      [facility]

## 2023-09-25 NOTE — Anesthesia Postprocedure Evaluation (Signed)
Anesthesia Post Note  Patient: Bruce Woodard  Procedure(s) Performed: COLONOSCOPY WITH PROPOFOL ESOPHAGOGASTRODUODENOSCOPY (EGD) WITH PROPOFOL  Patient location during evaluation: Endoscopy Anesthesia Type: General Level of consciousness: awake and alert Pain management: pain level controlled Vital Signs Assessment: post-procedure vital signs reviewed and stable Respiratory status: spontaneous breathing, nonlabored ventilation, respiratory function stable and patient connected to nasal cannula oxygen Cardiovascular status: blood pressure returned to baseline and stable Postop Assessment: no apparent nausea or vomiting Anesthetic complications: no  No notable events documented.   Last Vitals:  Vitals:   09/25/23 1158 09/25/23 1206  BP: 99/69   Pulse: 69 71  Resp: 12 12  Temp:    SpO2: 100% 100%    Last Pain:  Vitals:   09/25/23 1158  TempSrc:   PainSc: 0-No pain                 Stephanie Coup

## 2023-09-25 NOTE — H&P (Signed)
Midge Minium, MD [facility] [address], [address] [place], [place] [address] Phone:[phone] Fax : [phone]  Primary Care Physician:  Bethanie Dicker, NP Primary Gastroenterologist:  Dr. Servando Snare  Pre-Procedure History & Physical: HPI:  Bruce Woodard is a 37 y.o. male is here for an endoscopy and colonoscopy.   Past Medical History:  Diagnosis Date   Allergy    Asthma    as a child   GERD (gastroesophageal reflux disease)    Preventative health care 06/26/2023    Past Surgical History:  Procedure Laterality Date   REPAIR EXTENSOR TENDON Right 11/14/2015   Procedure: REPAIR EXTENSOR TENDON, 3rd and 5th;  Surgeon: Christena Flake, MD;  Location: [facility] ORS;  Service: Orthopedics;  Laterality: Right;    Prior to Admission medications   Medication Sig Start Date End Date Taking? Authorizing Provider  bismuth subsalicylate (PEPTO BISMOL) 262 MG/15ML suspension Take 30 mLs by mouth every 6 (six) hours as needed for indigestion.   Yes [provider]  omeprazole (PRILOSEC) 40 MG capsule Take 1 capsule (40 mg total) by mouth daily. Patient not taking: Reported on 09/16/2023 06/19/23   Bethanie Dicker, NP  rosuvastatin (CRESTOR) 10 MG tablet Take 1 tablet (10 mg total) by mouth daily. Patient not taking: Reported on 09/03/2023 06/27/23   Bethanie Dicker, NP    Allergies as of 08/15/2023 - Review Complete 07/01/2023  Allergen Reaction Noted   Peanut oil Anaphylaxis and Rash 01/30/2019   Penicillins Swelling and Anaphylaxis 09/25/2017   Shellfish allergy Anaphylaxis 02/10/2017   Shrimp extract Anaphylaxis 09/07/2020   Tomato Anaphylaxis 01/30/2019   Gramineae pollens  01/30/2019   Other  01/30/2019   Peanut-containing drug products  01/30/2019   Amoxicillin Swelling 11/02/2015    Family History  Problem Relation Age of Onset   Hypertension Mother    Rheum arthritis Mother    Arthritis Mother    Diabetes Father    Depression Maternal Grandmother    Diabetes Maternal Grandmother     Diabetes Sister     Social History   Socioeconomic History   Marital status: Married    Spouse name: Not on file   Number of children: Not on file   Years of education: Not on file   Highest education level: Not on file  Occupational History   Not on file  Tobacco Use   Smoking status: Never   Smokeless tobacco: Never  Vaping Use   Vaping status: Never Used  Substance and Sexual Activity   Alcohol use: Not Currently    Comment: socially   Drug use: No   Sexual activity: Yes    Birth control/protection: None    Comment: Last encounter: 14th, Unprotected with wife.  Other Topics Concern   Not on file  Social History Narrative   Not on file   Social Determinants of Health   Financial Resource Strain: Not on file  Food Insecurity: Not on file  Transportation Needs: Not on file  Physical Activity: Not on file  Stress: Not on file  Social Connections: Not on file  Intimate Partner Violence: Not on file    Review of Systems: See HPI, otherwise negative ROS  Physical Exam: BP (!) 140/92   Pulse 74   Temp 97.6 F (36.4 C) (Temporal)   Resp 18   Ht 5\' 11"  (1.803 m)   Wt 91 kg   SpO2 100%   BMI 27.98 kg/m  General:   Alert,  pleasant and cooperative in NAD Head:  Normocephalic and atraumatic. Neck:  Supple; no masses or thyromegaly. Lungs:  Clear throughout to auscultation.    Heart:  Regular rate and rhythm. Abdomen:  Soft, nontender and nondistended. Normal bowel sounds, without guarding, and without rebound.   Neurologic:  Alert and  oriented x4;  grossly normal neurologically.  Impression/Plan: Bruce Woodard is here for an endoscopy and colonoscopy to be performed for weight loss and abdominal pain  Risks, benefits, limitations, and alternatives regarding  endoscopy and colonoscopy have been reviewed with the patient.  Questions have been answered.  All parties agreeable.   Midge Minium, MD  09/25/2023, 10:55 AM

## 2023-09-25 NOTE — Op Note (Signed)
[facility] Gastroenterology Patient Name: Bruce Woodard Procedure Date: 09/25/2023 10:55 AM MRN: [id number] Account #: [id number] Date of Birth: 21-Jan-1986 Admit Type: Outpatient Age: 37 Room: [facility] ENDO ROOM 4 Gender: Male Note Status: Finalized Instrument Name: Upper Endoscope [id number] Procedure:             Upper GI endoscopy Indications:           Epigastric abdominal pain Providers:             Midge Minium MD, MD Referring MD:          Bethanie Dicker (Referring MD) Medicines:             Propofol per Anesthesia Complications:         No immediate complications. Procedure:             Pre-Anesthesia Assessment:                        - Prior to the procedure, a History and Physical was                         performed, and patient medications and allergies were                         reviewed. The patient's tolerance of previous                         anesthesia was also reviewed. The risks and benefits                         of the procedure and the sedation options and risks                         were discussed with the patient. All questions were                         answered, and informed consent was obtained. Prior                         Anticoagulants: The patient has taken no anticoagulant                         or antiplatelet agents. ASA Grade Assessment: II - A                         patient with mild systemic disease. After reviewing                         the risks and benefits, the patient was deemed in                         satisfactory condition to undergo the procedure.                        After obtaining informed consent, the endoscope was                         passed under direct vision. Throughout the procedure,  the patient's blood pressure, pulse, and oxygen                         saturations were monitored continuously. The Endoscope                         was introduced through the mouth, and  advanced to the                         second part of duodenum. The upper GI endoscopy was                         accomplished without difficulty. The patient tolerated                         the procedure well. Findings:      The examined esophagus was normal.      The entire examined stomach was normal. Biopsies were taken with a cold       forceps for histology.      The examined duodenum was normal. Biopsies were taken with a cold       forceps for histology. Impression:            - Normal esophagus.                        - Normal stomach. Biopsied.                        - Normal examined duodenum. Biopsied. Recommendation:        - Discharge patient to home.                        - Resume previous diet.                        - Continue present medications.                        - Await pathology results.                        - Perform a colonoscopy today. Procedure Code(s):     --- Professional ---                        [phone], Esophagogastroduodenoscopy, flexible,                         transoral; with biopsy, single or multiple Diagnosis Code(s):     --- Professional ---                        R10.13, Epigastric pain CPT copyright 2022 American Medical Association. All rights reserved. The codes documented in this report are preliminary and upon coder review may  be revised to meet current compliance requirements. Midge Minium MD, MD 09/25/2023 11:11:23 AM This report has been signed electronically. Number of Addenda: 0 Note Initiated On: 09/25/2023 10:55 AM Estimated Blood Loss:  Estimated blood loss: none.      [facility]

## 2023-09-26 ENCOUNTER — Encounter: Payer: Self-pay | Admitting: Gastroenterology

## 2023-09-26 LAB — SURGICAL PATHOLOGY

## 2023-09-29 ENCOUNTER — Encounter: Payer: Self-pay | Admitting: Gastroenterology

## 2023-09-30 ENCOUNTER — Other Ambulatory Visit: Payer: Self-pay

## 2023-09-30 DIAGNOSIS — R634 Abnormal weight loss: Secondary | ICD-10-CM

## 2023-09-30 DIAGNOSIS — R1012 Left upper quadrant pain: Secondary | ICD-10-CM

## 2023-10-01 ENCOUNTER — Telehealth: Payer: Self-pay

## 2023-10-01 ENCOUNTER — Encounter: Payer: Self-pay | Admitting: Gastroenterology

## 2023-10-01 NOTE — Telephone Encounter (Signed)
Tried to do PA on [employer] for patient and it said member not found please call the number on the back of the card. We only have the front of patient insurance card scanned in not back. Called patient and left a message to find out if he could send [place] his insurance card front and back. Sent mychart message to.

## 2023-10-01 NOTE — Telephone Encounter (Signed)
Tried again on the website and it still said member did not have active coverage which mean there site is still down

## 2023-10-01 NOTE — Telephone Encounter (Signed)
Called [employer] at [phone] and he Is a active member. They said to call [phone] and they said they are showing member not found and to call [employer]. Informed him I called [employer] and he is active till 10/21/2023. He gave me a reference number of [id number]. He then called another team and they told him to try another system. He states that still did not work and he states they know of the issue and they are working on getting it fix it is a system problem. He states till it is fix he is unable to do the PA over the phone or on the website.

## 2023-10-01 NOTE — Telephone Encounter (Signed)
Pt requesting call back returning call

## 2023-10-02 ENCOUNTER — Encounter: Payer: Self-pay | Admitting: Gastroenterology

## 2023-10-02 NOTE — Telephone Encounter (Signed)
Called [employer] and went patient plan [facility] is out of network. Patient has to go to [facility] and it has been approved case number is [id number]. Approval number is [id number] good from 10/02/2023 to 03/30/2024

## 2023-10-02 NOTE — Addendum Note (Signed)
Addended by: Roena Malady on: 10/02/2023 01:05 PM   Modules accepted: Orders

## 2023-10-02 NOTE — Telephone Encounter (Signed)
Pt does not need CT, cancelled... lmovm

## 2023-10-02 NOTE — Telephone Encounter (Signed)
Patient verbalized understanding of instructions and where [facility] was located he wrote down the address and phone number. He did have some questions for you and is wondering why he was having this done.

## 2023-10-02 NOTE — Telephone Encounter (Signed)
Called and canceled the CT scan at [facility]. Called [facility] and got patient schedule for 10/06/2023 arrive to [facility] at 10:00am for a 10:30am scan. He will need to come by there office before the appointment to pick up the 2 bottles of the contrast.

## 2023-10-04 DIAGNOSIS — R109 Unspecified abdominal pain: Secondary | ICD-10-CM | POA: Diagnosis not present

## 2023-10-04 DIAGNOSIS — E7800 Pure hypercholesterolemia, unspecified: Secondary | ICD-10-CM | POA: Diagnosis not present

## 2023-10-04 DIAGNOSIS — R1012 Left upper quadrant pain: Secondary | ICD-10-CM | POA: Diagnosis not present

## 2023-10-04 DIAGNOSIS — Z880 Allergy status to penicillin: Secondary | ICD-10-CM | POA: Diagnosis not present

## 2023-10-04 DIAGNOSIS — Z79899 Other long term (current) drug therapy: Secondary | ICD-10-CM | POA: Diagnosis not present

## 2023-10-05 DIAGNOSIS — R1012 Left upper quadrant pain: Secondary | ICD-10-CM | POA: Diagnosis not present

## 2023-10-06 ENCOUNTER — Inpatient Hospital Stay: Admission: RE | Admit: 2023-10-06 | Payer: 59 | Source: Ambulatory Visit

## 2023-10-06 ENCOUNTER — Ambulatory Visit: Payer: 59

## 2023-10-07 ENCOUNTER — Ambulatory Visit: Payer: 59 | Admitting: Family Medicine

## 2023-10-08 ENCOUNTER — Other Ambulatory Visit: Payer: Self-pay | Admitting: Nurse Practitioner

## 2023-10-08 DIAGNOSIS — Z3141 Encounter for fertility testing: Secondary | ICD-10-CM

## 2023-10-11 DIAGNOSIS — R1012 Left upper quadrant pain: Secondary | ICD-10-CM | POA: Diagnosis not present

## 2023-10-13 NOTE — Progress Notes (Deleted)
Cardiology Office Note  Date:  10/13/2023   ID:  [name], [name] 06/15/1986, MRN [id number]  PCP:  Bethanie Dicker, NP   No chief complaint on file.   HPI:  [name] is a 37 year old gentleman with past medical history of  Who presents via referral from Dr. Katrinka Blazing in the emergency room for chest pain  On review of records, numerous trips to the emergency room for abdominal pain and chest pain  Reported 3 months intermittent chest pain, worse at night or when he leans forward Denied chest pain with exertion Concerned he could have pericarditis given EKG abnormality Was given NSAIDs and referred to cardiology    PMH:   has a past medical history of Allergy, Asthma, GERD (gastroesophageal reflux disease), and Preventative health care (06/26/2023).  PSH:    Past Surgical History:  Procedure Laterality Date   BIOPSY  09/25/2023   Procedure: BIOPSY;  Surgeon: Midge Minium, MD;  Location: [facility] ENDOSCOPY;  Service: Endoscopy;;   COLONOSCOPY WITH PROPOFOL N/A 09/25/2023   Procedure: COLONOSCOPY WITH PROPOFOL;  Surgeon: Midge Minium, MD;  Location: [facility] ENDOSCOPY;  Service: Endoscopy;  Laterality: N/A;   ESOPHAGOGASTRODUODENOSCOPY (EGD) WITH PROPOFOL N/A 09/25/2023   Procedure: ESOPHAGOGASTRODUODENOSCOPY (EGD) WITH PROPOFOL;  Surgeon: Midge Minium, MD;  Location: [facility] ENDOSCOPY;  Service: Endoscopy;  Laterality: N/A;   REPAIR EXTENSOR TENDON Right 11/14/2015   Procedure: REPAIR EXTENSOR TENDON, 3rd and 5th;  Surgeon: Christena Flake, MD;  Location: [facility] ORS;  Service: Orthopedics;  Laterality: Right;    Current Outpatient Medications  Medication Sig Dispense Refill   bismuth subsalicylate (PEPTO BISMOL) 262 MG/15ML suspension Take 30 mLs by mouth every 6 (six) hours as needed for indigestion.     omeprazole (PRILOSEC) 40 MG capsule Take 1 capsule (40 mg total) by mouth daily. (Patient not taking: Reported on 09/16/2023) 90 capsule 3   rosuvastatin (CRESTOR) 10 MG tablet Take 1 tablet  (10 mg total) by mouth daily. (Patient not taking: Reported on 09/03/2023) 90 tablet 3   No current facility-administered medications for this visit.     Allergies:   Peanut oil, Penicillins, Shellfish allergy, Shrimp extract, Tomato, Gramineae pollens, Other, Peanut-containing drug products, Wheat, and Amoxicillin   Social History:  The patient  reports that he has never smoked. He has never used smokeless tobacco. He reports that he does not currently use alcohol. He reports that he does not use drugs.   Family History:   family history includes Arthritis in his mother; Depression in his maternal grandmother; Diabetes in his father, maternal grandmother, and sister; Hypertension in his mother; Rheum arthritis in his mother.    Review of Systems: ROS   PHYSICAL EXAM: VS:  There were no vitals taken for this visit. , BMI There is no height or weight on file to calculate BMI. GEN: Well nourished, well developed, in no acute distress HEENT: normal Neck: no JVD, carotid bruits, or masses Cardiac: RRR; no murmurs, rubs, or gallops,no edema  Respiratory:  clear to auscultation bilaterally, normal work of breathing GI: soft, nontender, nondistended, + BS MS: no deformity or atrophy Skin: warm and dry, no rash Neuro:  Strength and sensation are intact Psych: euthymic mood, full affect    Recent Labs: 06/26/2023: ALT 43; TSH 2.73 07/01/2023: BUN 18; Creatinine, Ser 1.19; Hemoglobin 14.7; Platelets 343; Potassium 3.8; Sodium 136    Lipid Panel Lab Results  Component Value Date   CHOL 302 (H) 06/26/2023   HDL 42.80 06/26/2023   TRIG (  H) 06/26/2023    415.0 Triglyceride is over 400; calculations on Lipids are invalid.      Wt Readings from Last 3 Encounters:  09/25/23 200 lb 9.6 oz (91 kg)  09/03/23 203 lb 12.8 oz (92.4 kg)  07/01/23 221 lb (100.2 kg)       ASSESSMENT AND PLAN:  Problem List Items Addressed This Visit   None    Disposition:   F/U  12 months   Total  encounter time more than 30 minutes  Greater than 50% was spent in counseling and coordination of care with the patient    Signed, Dossie Arbour, M.D., Ph.D. [facility] [place], [place] [phone]

## 2023-10-14 ENCOUNTER — Ambulatory Visit: Payer: 59 | Attending: Cardiovascular Disease | Admitting: Cardiovascular Disease

## 2023-10-16 ENCOUNTER — Encounter: Payer: Self-pay | Admitting: Cardiovascular Disease

## 2023-10-20 DIAGNOSIS — Z79899 Other long term (current) drug therapy: Secondary | ICD-10-CM | POA: Diagnosis not present

## 2023-10-20 DIAGNOSIS — R6883 Chills (without fever): Secondary | ICD-10-CM | POA: Diagnosis not present

## 2023-10-20 DIAGNOSIS — Z1152 Encounter for screening for COVID-19: Secondary | ICD-10-CM | POA: Diagnosis not present

## 2023-10-20 DIAGNOSIS — Z880 Allergy status to penicillin: Secondary | ICD-10-CM | POA: Diagnosis not present

## 2023-10-20 DIAGNOSIS — J101 Influenza due to other identified influenza virus with other respiratory manifestations: Secondary | ICD-10-CM | POA: Diagnosis not present

## 2023-11-04 ENCOUNTER — Other Ambulatory Visit: Payer: Self-pay

## 2023-11-04 ENCOUNTER — Emergency Department
Admission: EM | Admit: 2023-11-04 | Discharge: 2023-11-04 | Disposition: A | Discharge status: Home / Self Care | Payer: 59 | Attending: Emergency Medicine | Admitting: Emergency Medicine

## 2023-11-04 DIAGNOSIS — M940 Chondrocostal junction syndrome [Tietze]: Secondary | ICD-10-CM | POA: Insufficient documentation

## 2023-11-04 DIAGNOSIS — R0789 Other chest pain: Secondary | ICD-10-CM | POA: Diagnosis not present

## 2023-11-04 DIAGNOSIS — R1013 Epigastric pain: Secondary | ICD-10-CM | POA: Insufficient documentation

## 2023-11-04 LAB — CBC
HCT: 42.1 % (ref 39.0–52.0)
Hemoglobin: 15 g/dL (ref 13.0–17.0)
MCH: 32.2 pg (ref 26.0–34.0)
MCHC: 35.6 g/dL (ref 30.0–36.0)
MCV: 90.3 fL (ref 80.0–100.0)
Platelets: 355 10*3/uL (ref 150–400)
RBC: 4.66 MIL/uL (ref 4.22–5.81)
RDW: 12.4 % (ref 11.5–15.5)
WBC: 6.5 10*3/uL (ref 4.0–10.5)
nRBC: 0 % (ref 0.0–0.2)

## 2023-11-04 LAB — URINALYSIS, ROUTINE W REFLEX MICROSCOPIC
Bilirubin Urine: NEGATIVE
Glucose, UA: NEGATIVE mg/dL
Hgb urine dipstick: NEGATIVE
Ketones, ur: NEGATIVE mg/dL
Leukocytes,Ua: NEGATIVE
Nitrite: NEGATIVE
Protein, ur: NEGATIVE mg/dL
Specific Gravity, Urine: 1.019 (ref 1.005–1.030)
pH: 6 (ref 5.0–8.0)

## 2023-11-04 LAB — COMPREHENSIVE METABOLIC PANEL
ALT: 36 U/L (ref 0–44)
AST: 27 U/L (ref 15–41)
Albumin: 4.3 g/dL (ref 3.5–5.0)
Alkaline Phosphatase: 83 U/L (ref 38–126)
Anion gap: 7 (ref 5–15)
BUN: 11 mg/dL (ref 6–20)
CO2: 27 mmol/L (ref 22–32)
Calcium: 9.1 mg/dL (ref 8.9–10.3)
Chloride: 103 mmol/L (ref 98–111)
Creatinine, Ser: 1.13 mg/dL (ref 0.61–1.24)
GFR, Estimated: >60 mL/min (ref >60)
Glucose, Bld: 95 mg/dL (ref 70–99)
Potassium: 3.7 mmol/L (ref 3.5–5.1)
Sodium: 137 mmol/L (ref 135–145)
Total Bilirubin: 0.7 mg/dL (ref 0.0–1.2)
Total Protein: 7.6 g/dL (ref 6.5–8.1)

## 2023-11-04 LAB — LIPASE, BLOOD: Lipase: 37 U/L (ref 11–51)

## 2023-11-04 MED ORDER — CYCLOBENZAPRINE HCL 5 MG PO TABS: 5.0000 mg | ORAL_TABLET | Freq: Three times a day (TID) | ORAL | 0 refills | Status: DC | PRN

## 2023-11-04 MED ORDER — LIDOCAINE 5 % EX PTCH: 1.0000 | MEDICATED_PATCH | Freq: Two times a day (BID) | CUTANEOUS | 0 refills | Status: AC | PRN

## 2023-11-04 NOTE — Discharge Instructions (Addendum)
 Your exam and labs are normal and reassuring at this time. Your symptoms are concerning for costochondritis. Your GI work-up is overall normal. You should log your foods and reactions. Follow-up with Dr. Servando Snare for further evaluation.

## 2023-11-04 NOTE — ED Provider Notes (Signed)
 Sick day sickly   [facility] Emergency Department Provider Note     Event Date/Time   First MD Initiated Contact with Patient 11/04/23 1540     (approximate)   History   Abdominal Pain   HPI  Bruce Woodard is a 38 y.o. male with a history of hiatal hernia who presents with a few months of intermittent LUQ abdominal pain. The patient had an endoscopy with GI a few weeks ago (09/25/23) regarding his pain in which he had biopsies taken. The biopsy pathology resulted with unremarkable findings. His pain has been unchanged since having the endoscopy done. He last had a CT A/P in July 2024 regarding his pain that resulted showing no acute abnormalities. Per chart review, he has been seen in the ED multiple times regarding his pain, most recently on 10/04/23 (1 week ago). His laboratory studies from this visit resulted reassuring. Imaging was not obtained at that time. He was apparently advised that if symptoms were no better, he should be re-evaluated. He presents himself to the ED for evaluation. He denies NVD, FCS.  Patient endorses pain to the inferior rib border bilaterally, pain is reproducible on palpation.   Physical Exam   Triage Vital Signs: ED Triage Vitals [11/04/23 1329]  Encounter Vitals Group     BP 131/78     Systolic BP Percentile      Diastolic BP Percentile      Pulse Rate 80     Resp 17     Temp 98.6 F (37 C)     Temp src      SpO2 99 %     Weight 200 lb 9.9 oz (91 kg)     Height 5' 11 (1.803 m)     Head Circumference      Peak Flow      Pain Score 10     Pain Loc      Pain Education      Exclude from Growth Chart     Most recent vital signs: Vitals:   11/04/23 1329  BP: 131/78  Pulse: 80  Resp: 17  Temp: 98.6 F (37 C)  SpO2: 99%    General Awake, no distress. NAD HEENT NCAT. PERRL. EOMI. No rhinorrhea. Mucous membranes are moist.  CV:  Good peripheral perfusion. RRR RESP:  Normal effort. CTA.  Patient with tenderness  to palpation to the xiphoid process and inferior rib borders bilaterally. ABD:  No distention.  Soft and nontender.  Normal bowel sounds noted.  No rebound, guarding, or rigidity noted.   ED Results / Procedures / Treatments   Labs (all labs ordered are listed, but only abnormal results are displayed) Labs Reviewed  URINALYSIS, ROUTINE W REFLEX MICROSCOPIC - Abnormal; Notable for the following components:      Result Value   Color, Urine YELLOW (*)    APPearance CLEAR (*)    All other components within normal limits  LIPASE, BLOOD  COMPREHENSIVE METABOLIC PANEL  CBC     EKG   RADIOLOGY  No results found.   PROCEDURES:  Critical Care performed: No  Procedures   MEDICATIONS ORDERED IN ED: Medications - No data to display   IMPRESSION / MDM / ASSESSMENT AND PLAN / ED COURSE  I reviewed the triage vital signs and the nursing notes.  Differential diagnosis includes, but is not limited to, biliary disease (biliary colic, acute cholecystitis, cholangitis, choledocholithiasis, etc), intrathoracic causes for epigastric abdominal pain including ACS, gastritis, duodenitis, pancreatitis, small bowel or large bowel obstruction, abdominal aortic aneurysm, hernia, and ulcer(s).   Patient's presentation is most consistent with acute complicated illness / injury requiring diagnostic workup.  Patient's diagnosis is consistent with epigastric pain which may be chronic in nature related to GERD or reflux.  Patient also presents with what appears to be costochondritis.  His primary complaint is this reproducible pain to palpation along the anterior inferior chest/epigastric region.  Labs are reassuring with no acute electrolyte abnormalities.  No leukocytosis, and UA is negative at this time.  Patient will be discharged home with prescriptions for cyclobenzaprine  and Lidoderm  patches. Patient is to follow up with his PCP or GI medicine, as discussed, as needed  or otherwise directed. Patient is given ED precautions to return to the ED for any worsening or new symptoms.   FINAL CLINICAL IMPRESSION(S) / ED DIAGNOSES   Final diagnoses:  Epigastric pain  Costochondritis     Rx / DC Orders   ED Discharge Orders          Ordered    cyclobenzaprine  (FLEXERIL ) 5 MG tablet  3 times daily PRN        11/04/23 1618    lidocaine  (LIDODERM ) 5 %  Every 12 hours PRN        11/04/23 1618             Note:  This document was prepared using Dragon voice recognition software and may include unintentional dictation errors.    Loyd Candida LULLA Aldona, PA-C 11/08/23 2141    Dorothyann Drivers, MD 11/09/23 [phone]

## 2023-11-04 NOTE — ED Triage Notes (Signed)
 Pt to ED for abd pain for a few months. Denies n/v/d.

## 2023-11-04 NOTE — ED Provider Triage Note (Signed)
 Emergency Medicine Provider Triage Evaluation Note  Bruce Woodard , a 38 y.o. male  was evaluated in triage.  Pt complains of abdominal pain for months, neg endoscopy, told if not better to go to the doctor so came to the ER.  Review of Systems  Positive:  Negative:   Physical Exam  BP 131/78   Pulse 80   Temp 98.6 F (37 C)   Resp 17   Ht 5' 11 (1.803 m)   Wt 91 kg   SpO2 99%   BMI 27.98 kg/m  Gen:   Awake, no distress   Resp:  Normal effort  MSK:   Moves extremities without difficulty  Other:    Medical Decision Making  Medically screening exam initiated at 1:31 PM.  Appropriate orders placed.  Bruce Woodard was informed that the remainder of the evaluation will be completed by another provider, this initial triage assessment does not replace that evaluation, and the importance of remaining in the ED until their evaluation is complete.     Gasper Devere ORN, PA-C 11/04/23 1331

## 2023-11-10 ENCOUNTER — Telehealth: Payer: Self-pay

## 2023-11-10 NOTE — Progress Notes (Signed)
Transition Care Management Unsuccessful Follow-up Telephone Call  Date of discharge and from where:  11/04/2023 [facility]  Attempts:  1st Attempt  Reason for unsuccessful TCM follow-up call:  Unable to reach patient  Bruce Woodard [facility]  [facility] Guide Direct Dial: [phone]  Fax: [phone] Website: [email or web address]

## 2023-12-01 ENCOUNTER — Telehealth: Payer: Self-pay

## 2023-12-01 NOTE — Telephone Encounter (Signed)
 Copied from CRM [id number]. Topic: Referral - Question >> Dec 01, 2023 11:01 AM Earnestine Goes B wrote: Reason for CRM: pt called to request a different Urologist. Pt stated he want to go to a [place] Urologist . Please call pt back at [phone]

## 2023-12-02 ENCOUNTER — Other Ambulatory Visit: Payer: Self-pay | Admitting: Nurse Practitioner

## 2023-12-02 DIAGNOSIS — Z3141 Encounter for fertility testing: Secondary | ICD-10-CM

## 2023-12-15 ENCOUNTER — Telehealth: Payer: Self-pay | Admitting: Gastroenterology

## 2023-12-15 NOTE — Telephone Encounter (Signed)
 The patient called and left a voicemail stating that he is still having upper left abdominal pain. I called him back to let him know that we received his message and I sent the message to the nurse.

## 2023-12-15 NOTE — Telephone Encounter (Signed)
 It looks like pt has been to ED 4 times since EGD and colonoscopy from Dec 2024. Please advise on what I should tell pt

## 2024-03-09 ENCOUNTER — Telehealth: Payer: Self-pay

## 2024-03-09 ENCOUNTER — Telehealth: Payer: Self-pay | Admitting: Family Medicine

## 2024-03-09 DIAGNOSIS — J039 Acute tonsillitis, unspecified: Secondary | ICD-10-CM

## 2024-03-09 MED ORDER — AZITHROMYCIN 250 MG PO TABS: ORAL_TABLET | ORAL | 0 refills | Status: AC

## 2024-03-09 NOTE — Progress Notes (Signed)
  E-Visit for Sore Throat  We are sorry that you are not feeling well.  Here is how we plan to help!  Your symptoms indicate a likely viral infection (Pharyngitis).   Pharyngitis is inflammation in the back of the throat which can cause a sore throat, scratchiness and sometimes difficulty swallowing.   Pharyngitis is typically caused by a respiratory virus and will just run its course.  Please keep in mind that your symptoms could last up to 10 days.  For throat pain, we recommend over the counter oral pain relief medications such as acetaminophen  or aspirin, or anti-inflammatory medications such as ibuprofen  or naproxen sodium.  Topical treatments such as oral throat lozenges or sprays may be used as needed.  Avoid close contact with loved ones, especially the very young and elderly.  Remember to wash your hands thoroughly throughout the day as this is the number one way to prevent the spread of infection and wipe down door knobs and counters with disinfectant.  Giving you also have tonsillitis as noted on your attached pictures, I will add on an antibiotic as a precaution. Please take as directed.  Home Care: Only take medications as instructed by your medical team. Do not drink alcohol while taking these medications. A steam or ultrasonic humidifier can help congestion.  You can place a towel over your head and breathe in the steam from hot water coming from a faucet. Avoid close contacts especially the very young and the elderly. Cover your mouth when you cough or sneeze. Always remember to wash your hands.  Get Help Right Away If: You develop worsening fever or throat pain. You develop a severe head ache or visual changes. Your symptoms persist after you have completed your treatment plan.  Make sure you Understand these instructions. Will watch your condition. Will get help right away if you are not doing well or get worse.   Thank you for choosing an e-visit.  Your e-visit answers  were reviewed by a board certified advanced clinical practitioner to complete your personal care plan. Depending upon the condition, your plan could have included both over the counter or prescription medications.  Please review your pharmacy choice. Make sure the pharmacy is open so you can pick up prescription now. If there is a problem, you may contact your provider through [employer] and have the prescription routed to another pharmacy.  Your safety is important to us . If you have drug allergies check your prescription carefully.   For the next 24 hours you can use MyChart to ask questions about today's visit, request a non-urgent call back, or ask for a work or school excuse. You will get an email in the next two days asking about your experience. I hope that your e-visit has been valuable and will speed your recovery.

## 2024-03-09 NOTE — Progress Notes (Signed)
 Message sent to patient requesting further input regarding current symptoms. Awaiting patient response.

## 2024-03-09 NOTE — Progress Notes (Signed)
 I have spent 5 minutes in review of e-visit questionnaire, review and updating patient chart, medical decision making and response to patient.   Piedad Climes, PA-C

## 2024-04-21 ENCOUNTER — Ambulatory Visit
Admission: EM | Admit: 2024-04-21 | Discharge: 2024-04-21 | Disposition: A | Discharge status: Home / Self Care | Source: Ambulatory Visit | Attending: Emergency Medicine | Admitting: Emergency Medicine

## 2024-04-21 DIAGNOSIS — Z113 Encounter for screening for infections with a predominantly sexual mode of transmission: Secondary | ICD-10-CM | POA: Diagnosis present

## 2024-04-21 LAB — HIV ANTIBODY (ROUTINE TESTING W REFLEX): HIV Screen 4th Generation wRfx: NONREACTIVE

## 2024-04-21 NOTE — ED Triage Notes (Signed)
 Pt presents to UC for STD testing. Denies any active sx's.

## 2024-04-21 NOTE — Discharge Instructions (Addendum)
 Your testing for STIs will be back in the next 1 to 2 days.  If you test positive for any infection you will be contacted by phone and treatment options will be provided.  If your results are negative they will appear in your MyChart.

## 2024-04-21 NOTE — ED Provider Notes (Signed)
 [facility]    CSN: [id number] Arrival date & time: 04/21/24  1743      History   Chief Complaint Chief Complaint  Patient presents with   SEXUALLY TRANSMITTED DISEASE    HPI Bruce Woodard is a 38 y.o. male.   HPI  38 year old male with past medical history significant for asthma as a child, GERD, and food allergies presents with request for STI testing.  He reports that he had unprotected sex with a new partner.  He is not experiencing any penile discharge or dysuria.  He is requesting HIV and syphilis testing in order to know his own status.  Past Medical History:  Diagnosis Date   Allergy     Asthma    as a child   GERD (gastroesophageal reflux disease)    Preventative health care 06/26/2023    Patient Active Problem List   Diagnosis Date Noted   Loss of weight 09/25/2023   Early satiety 09/25/2023   Chest pain of uncertain etiology 09/01/2023   Preventative health care 06/26/2023   Epigastric abdominal pain 06/19/2023   Hiatal hernia with GERD 06/19/2023   Allergy , food 12/21/2019   Laceration of extensor tendon of hand 11/23/2015    Past Surgical History:  Procedure Laterality Date   BIOPSY  09/25/2023   Procedure: BIOPSY;  Surgeon: Jinny Carmine, MD;  Location: [facility] ENDOSCOPY;  Service: Endoscopy;;   COLONOSCOPY WITH PROPOFOL  N/A 09/25/2023   Procedure: COLONOSCOPY WITH PROPOFOL ;  Surgeon: Jinny Carmine, MD;  Location: [facility] ENDOSCOPY;  Service: Endoscopy;  Laterality: N/A;   ESOPHAGOGASTRODUODENOSCOPY (EGD) WITH PROPOFOL  N/A 09/25/2023   Procedure: ESOPHAGOGASTRODUODENOSCOPY (EGD) WITH PROPOFOL ;  Surgeon: Jinny Carmine, MD;  Location: [facility] ENDOSCOPY;  Service: Endoscopy;  Laterality: N/A;   REPAIR EXTENSOR TENDON Right 11/14/2015   Procedure: REPAIR EXTENSOR TENDON, 3rd and 5th;  Surgeon: Norleen JINNY Maltos, MD;  Location: [facility] ORS;  Service: Orthopedics;  Laterality: Right;       Home Medications    Prior to Admission medications   Medication Sig  Start Date End Date Taking? Authorizing Provider  bismuth subsalicylate (PEPTO BISMOL) 262 MG/15ML suspension Take 30 mLs by mouth every 6 (six) hours as needed for indigestion.    [provider]  cyclobenzaprine  (FLEXERIL ) 5 MG tablet Take 1 tablet (5 mg total) by mouth 3 (three) times daily as needed. 11/04/23   Menshew, Candida LULLA Kings, PA-C  omeprazole  (PRILOSEC) 40 MG capsule Take 1 capsule (40 mg total) by mouth daily. Patient not taking: Reported on 09/16/2023 06/19/23   Gretel App, NP  rosuvastatin  (CRESTOR ) 10 MG tablet Take 1 tablet (10 mg total) by mouth daily. Patient not taking: Reported on 09/03/2023 06/27/23   Gretel App, NP    Family History Family History  Problem Relation Age of Onset   Hypertension Mother    Rheum arthritis Mother    Arthritis Mother    Diabetes Father    Depression Maternal Grandmother    Diabetes Maternal Grandmother    Diabetes Sister     Social History Social History   Tobacco Use   Smoking status: Never   Smokeless tobacco: Never  Vaping Use   Vaping status: Never Used  Substance Use Topics   Alcohol use: Not Currently    Comment: socially   Drug use: No     Allergies   Peanut oil, Penicillins, Shellfish allergy , Shrimp extract, Tomato, Gramineae pollens, Other, Peanut-containing drug products, Wheat, and Amoxicillin   Review of Systems Review of Systems  Genitourinary:  Negative for dysuria, frequency, genital sores, hematuria, penile discharge, penile pain, penile swelling, scrotal swelling, testicular pain and urgency.     Physical Exam Triage Vital Signs ED Triage Vitals  Encounter Vitals Group     BP      Girls Systolic BP Percentile      Girls Diastolic BP Percentile      Boys Systolic BP Percentile      Boys Diastolic BP Percentile      Pulse      Resp      Temp      Temp src      SpO2      Weight      Height      Head Circumference      Peak Flow      Pain Score      Pain Loc      Pain  Education      Exclude from Growth Chart    No data found.  Updated Vital Signs BP 135/81 (BP Location: Right Arm)   Pulse 65   Temp 98.5 F (36.9 C) (Oral)   Resp 16   Ht 6' (1.829 m)   Wt 205 lb (93 kg)   SpO2 97%   BMI 27.80 kg/m   Visual Acuity Right Eye Distance:   Left Eye Distance:   Bilateral Distance:    Right Eye Near:   Left Eye Near:    Bilateral Near:     Physical Exam Vitals and nursing note reviewed.  Constitutional:      Appearance: Normal appearance. He is not ill-appearing.  HENT:     Head: Normocephalic and atraumatic.  Skin:    General: Skin is warm and dry.     Capillary Refill: Capillary refill takes less than 2 seconds.     Findings: No rash.  Neurological:     General: No focal deficit present.     Mental Status: He is alert and oriented to person, place, and time.      UC Treatments / Results  Labs (all labs ordered are listed, but only abnormal results are displayed) Labs Reviewed  HIV ANTIBODY (ROUTINE TESTING W REFLEX)  RPR  CYTOLOGY, (ORAL, ANAL, URETHRAL) ANCILLARY ONLY    EKG   Radiology No results found.  Procedures Procedures (including critical care time)  Medications Ordered in UC Medications - No data to display  Initial Impression / Assessment and Plan / UC Course  I have reviewed the triage vital signs and the nursing notes.  Pertinent labs & imaging results that were available during my care of the patient were reviewed by me and considered in my medical decision making (see chart for details).   Patient is a nontoxic-appearing 38 year old male presenting with request for STI testing after having unprotected sex with a new partner.  He is not having any symptoms at present.  Initially, he was requesting only HIV and syphilis testing.  He reports that his partner had a full panel of STI testing and she was negative for any infection.  I advised the patient that even though he is not having any symptoms it does  not mean that he does not necessarily have an STI.  He did consent to urethral swab for gonorrhea, chlamydia, and trichomonas.  I we will discharge the patient home with a diagnosis of routine STI screening and notify him that if he test positive for any infection he will be contacted by phone and treatment options will be  provided.  If his results are negative they will appear in his MyChart.   Final Clinical Impressions(s) / UC Diagnoses   Final diagnoses:  Routine screening for STI (sexually transmitted infection)     Discharge Instructions      Your testing for STIs will be back in the next 1 to 2 days.  If you test positive for any infection you will be contacted by phone and treatment options will be provided.  If your results are negative they will appear in your MyChart.     ED Prescriptions   None    PDMP not reviewed this encounter.   Bernardino Ditch, NP 04/21/24 1758

## 2024-04-22 ENCOUNTER — Ambulatory Visit
Admission: EM | Admit: 2024-04-22 | Discharge: 2024-04-22 | Disposition: A | Discharge status: Home / Self Care | Attending: Emergency Medicine | Admitting: Emergency Medicine

## 2024-04-22 DIAGNOSIS — Z113 Encounter for screening for infections with a predominantly sexual mode of transmission: Secondary | ICD-10-CM | POA: Insufficient documentation

## 2024-04-22 LAB — RPR: RPR Ser Ql: NONREACTIVE

## 2024-04-22 NOTE — ED Triage Notes (Signed)
 Pt was seen 04/21/24 his cytology swab was not labeled. Pt presents for recollection.

## 2024-04-26 ENCOUNTER — Ambulatory Visit: Payer: Self-pay | Admitting: Internal Medicine

## 2024-04-26 LAB — CYTOLOGY, (ORAL, ANAL, URETHRAL) ANCILLARY ONLY
Chlamydia: NEGATIVE
Comment: NEGATIVE
Comment: NEGATIVE
Comment: NORMAL
Neisseria Gonorrhea: NEGATIVE
Trichomonas: NEGATIVE

## 2024-05-23 DIAGNOSIS — E782 Mixed hyperlipidemia: Secondary | ICD-10-CM | POA: Insufficient documentation

## 2024-05-24 ENCOUNTER — Encounter: Payer: Self-pay | Admitting: Emergency Medicine

## 2024-05-24 ENCOUNTER — Emergency Department

## 2024-05-24 ENCOUNTER — Emergency Department
Admission: EM | Admit: 2024-05-24 | Discharge: 2024-05-24 | Disposition: A | Discharge status: Home / Self Care | Attending: Emergency Medicine | Admitting: Emergency Medicine

## 2024-05-24 ENCOUNTER — Other Ambulatory Visit: Payer: Self-pay

## 2024-05-24 DIAGNOSIS — R519 Headache, unspecified: Secondary | ICD-10-CM | POA: Diagnosis present

## 2024-05-24 MED ORDER — ACETAMINOPHEN 500 MG PO TABS
1000.0000 mg | ORAL_TABLET | Freq: Once | ORAL | Status: AC
  Administered 2024-05-24: 1000 mg via ORAL
  Filled 2024-05-24: qty 2

## 2024-05-24 MED ORDER — BUTALBITAL-APAP-CAFFEINE 50-325-40 MG PO TABS: 1.0000 | ORAL_TABLET | Freq: Four times a day (QID) | ORAL | 0 refills | Status: AC | PRN

## 2024-05-24 NOTE — ED Notes (Addendum)
 Pt complaining of headache since this past Friday after eating chinese food. Pt denies any recent or ongoing headaches. Pt does report light sensitivity and states he can't go outside during daylight due to same. Pt has equal and round pupil reactions bilaterally. Denies vision changes. Neuro assessment WDL.

## 2024-05-24 NOTE — ED Provider Notes (Signed)
 [facility] Provider Note    Event Date/Time   First MD Initiated Contact with Patient 05/24/24 [phone]     (approximate)   History   Headache   HPI  [name] is a 38 y.o. male with no PMH who presents for evaluation of a headache.  Patient states that the headache began on Friday and he has had some improvement when taking Excedrin and Aleve.  He does endorse light sensitivity but denies vision changes, nausea, vomiting and weakness.  Patient states that he does not get headaches normally.  Patient states his boss noticed he was uncomfortable and advised him to go to the doctor to be evaluated.      Physical Exam   Triage Vital Signs: ED Triage Vitals  Encounter Vitals Group     BP 05/24/24 0805 126/86     Girls Systolic BP Percentile --      Girls Diastolic BP Percentile --      Boys Systolic BP Percentile --      Boys Diastolic BP Percentile --      Pulse Rate 05/24/24 0805 64     Resp 05/24/24 0805 18     Temp 05/24/24 0805 (!) 97.5 F (36.4 C)     Temp Source 05/24/24 0805 Oral     SpO2 05/24/24 0805 100 %     Weight 05/24/24 0804 205 lb (93 kg)     Height 05/24/24 0804 6' (1.829 m)     Head Circumference --      Peak Flow --      Pain Score 05/24/24 0804 10     Pain Loc --      Pain Education --      Exclude from Growth Chart --     Most recent vital signs: Vitals:   05/24/24 0805  BP: 126/86  Pulse: 64  Resp: 18  Temp: (!) 97.5 F (36.4 C)  SpO2: 100%   General: Awake, no distress.  CV:  Good peripheral perfusion.  RRR. Resp:  Normal effort.  CTAB. Abd:  No distention.  Other:  PERRL, EOM intact, no focal neurodeficits, no ataxia, no pronator drift.   ED Results / Procedures / Treatments   Labs (all labs ordered are listed, but only abnormal results are displayed) Labs Reviewed - No data to display   RADIOLOGY  CT head obtained, interpreted the images as well as reviewed the radiologist report which was  negative for any acute abnormalities.   PROCEDURES:  Critical Care performed: No  Procedures   MEDICATIONS ORDERED IN ED: Medications  acetaminophen  (TYLENOL ) tablet 1,000 mg (1,000 mg Oral Given 05/24/24 0834)     IMPRESSION / MDM / ASSESSMENT AND PLAN / ED COURSE  I reviewed the triage vital signs and the nursing notes.                             38 year old male presents for evaluation of a headache.  Vital signs are stable patient NAD on exam.  Differential diagnosis includes, but is not limited to, tension headache, migraine, less likely intracranial bleed.  Patient's presentation is most consistent with acute complicated illness / injury requiring diagnostic workup.  Physical exam is overall reassuring and I did not identify any focal neurodeficits.  I did offer to get a CT scan as patient does not normally get headaches which he was agreeable to.  I discussed treating his symptoms with  migraine cocktail involving some IV medications and IV fluids.  Patient would prefer to stick to oral medicine only and wants to avoid sedating medications so that he is able to return to work today.  He states he mostly came to get a prescription for a medication he can take at home.  Will treat patient with Tylenol  and we will plan on prescribing Fioricet.  Feel patient would be stable for outpatient management if the CT head is normal.  Clinical Course as of 05/24/24 0946  [facility] May 24, 2024  9061 CT Head Wo Contrast No acute abnormalities. [LD]  [phone] Patient reported an improvement in his symptoms after receiving the Tylenol .  Discussed over-the-counter treatment options for home.  Patient voiced understanding, all questions were answered he stable at discharge. [LD]    Clinical Course User Index [LD] Cleaster Tinnie LABOR, PA-C     FINAL CLINICAL IMPRESSION(S) / ED DIAGNOSES   Final diagnoses:  Acute nonintractable headache, unspecified headache type     Rx / DC Orders   ED  Discharge Orders          Ordered    butalbital -acetaminophen -caffeine  (FIORICET) 50-325-40 MG tablet  Every 6 hours PRN        05/24/24 0834             Note:  This document was prepared using Dragon voice recognition software and may include unintentional dictation errors.   Cleaster Tinnie LABOR, PA-C 05/24/24 9053    Levander Slate, MD 05/24/24 [phone]

## 2024-05-24 NOTE — ED Triage Notes (Signed)
 Patient to ED via POV for headache since Friday. States pain is behind his eyes. Denies light sensitivity.

## 2024-05-24 NOTE — Discharge Instructions (Addendum)
 The CT scan of your head was normal today.   Please continue to treat your headache using over the counter medications like Tylenol , Aleve or Excedrin. If you are unable to get your headache to go away using these medications you can take Fioricet. This medication is sedating. You cannot drive, work or drink alcohol after taking it. Do not take tylenol  or Excedrin with the fioricet as they all contain the same ingredients as tylenol . You can however continue to take the Aleve.  Return to the ED with any worsening symptoms. Follow up with your primary care provider as needed.

## 2024-06-25 ENCOUNTER — Encounter: Payer: Self-pay | Admitting: Emergency Medicine

## 2024-06-25 ENCOUNTER — Ambulatory Visit
Admission: EM | Admit: 2024-06-25 | Discharge: 2024-06-25 | Disposition: A | Discharge status: Home / Self Care | Attending: Family Medicine | Admitting: Family Medicine

## 2024-06-25 DIAGNOSIS — J069 Acute upper respiratory infection, unspecified: Secondary | ICD-10-CM | POA: Diagnosis present

## 2024-06-25 LAB — GROUP A STREP BY PCR: Group A Strep by PCR: NOT DETECTED

## 2024-06-25 LAB — SARS CORONAVIRUS 2 BY RT PCR: SARS Coronavirus 2 by RT PCR: NEGATIVE

## 2024-06-25 MED ORDER — IPRATROPIUM BROMIDE 0.06 % NA SOLN
2.0000 | Freq: Four times a day (QID) | NASAL | 0 refills | Status: DC
Start: 2024-06-25 — End: 2024-07-05

## 2024-06-25 NOTE — ED Triage Notes (Signed)
 Patient c/o sore throat, bodyaches, chills, and headache that started on Monday.  Patient unsure of fevers.

## 2024-06-25 NOTE — ED Provider Notes (Signed)
 [facility]    CSN: [id number] Arrival date & time: 06/25/24  9177      History   Chief Complaint Chief Complaint  Patient presents with   Sore Throat   Generalized Body Aches    HPI Bruce Woodard is a 38 y.o. male.   HPI  History obtained from the patient. Bruce Woodard presents for sore throat with painful swallowing.  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 Endorses headache, belly pain, body aches, nasal congestion and slight cough.  No medications prior to arrival.  Denies vomiting and diarrhea.       Past Medical History:  Diagnosis Date   Allergy     Asthma    as a child   GERD (gastroesophageal reflux disease)    Preventative health care 06/26/2023    Patient Active Problem List   Diagnosis Date Noted   Loss of weight 09/25/2023   Early satiety 09/25/2023   Chest pain of uncertain etiology 09/01/2023   Preventative health care 06/26/2023   Epigastric abdominal pain 06/19/2023   Hiatal hernia with GERD 06/19/2023   Allergy , food 12/21/2019   Laceration of extensor tendon of hand 11/23/2015    Past Surgical History:  Procedure Laterality Date   BIOPSY  09/25/2023   Procedure: BIOPSY;  Surgeon: Jinny Carmine, MD;  Location: [facility];  Service: Endoscopy;;   COLONOSCOPY WITH PROPOFOL  N/A 09/25/2023   Procedure: COLONOSCOPY WITH PROPOFOL ;  Surgeon: Jinny Carmine, MD;  Location: [facility];  Service: Endoscopy;  Laterality: N/A;   ESOPHAGOGASTRODUODENOSCOPY (EGD) WITH PROPOFOL  N/A 09/25/2023   Procedure: ESOPHAGOGASTRODUODENOSCOPY (EGD) WITH PROPOFOL ;  Surgeon: Jinny Carmine, MD;  Location: [facility];  Service: Endoscopy;  Laterality: N/A;   REPAIR EXTENSOR TENDON Right 11/14/2015   Procedure: REPAIR EXTENSOR TENDON, 3rd and 5th;  Surgeon: Norleen JINNY Maltos, MD;  Location: [facility];  Service: Orthopedics;  Laterality: Right;       Home Medications    Prior to Admission medications   Medication Sig Start Date End Date  Taking? Authorizing Provider  ipratropium (ATROVENT ) 0.06 % nasal spray Place 2 sprays into both nostrils 4 (four) times daily. 06/25/24  Yes Paityn Balsam, DO  bismuth subsalicylate (PEPTO BISMOL) 262 MG/15ML suspension Take 30 mLs by mouth every 6 (six) hours as needed for indigestion.    [provider]  butalbital -acetaminophen -caffeine  (FIORICET) 50-325-40 MG tablet Take 1 tablet by mouth every 6 (six) hours as needed for headache. 05/24/24 05/24/25  Cleaster Tinnie LABOR, PA-C  cyclobenzaprine  (FLEXERIL ) 5 MG tablet Take 1 tablet (5 mg total) by mouth 3 (three) times daily as needed. 11/04/23   Menshew, Candida LULLA Kings, PA-C  omeprazole  (PRILOSEC) 40 MG capsule Take 1 capsule (40 mg total) by mouth daily. Patient not taking: Reported on 09/16/2023 06/19/23   Gretel App, NP  rosuvastatin  (CRESTOR ) 10 MG tablet Take 1 tablet (10 mg total) by mouth daily. Patient not taking: Reported on 09/03/2023 06/27/23   Gretel App, NP    Family History Family History  Problem Relation Age of Onset   Hypertension Mother    Rheum arthritis Mother    Arthritis Mother    Diabetes Father    Depression Maternal Grandmother    Diabetes Maternal Grandmother    Diabetes Sister     Social History Social History   Tobacco Use   Smoking status: Never   Smokeless tobacco: Never  Vaping Use   Vaping status: Never Used  Substance Use Topics   Alcohol  use: Not Currently    Comment: socially   Drug use: No     Allergies   Peanut oil, Penicillins, Shellfish allergy , Shrimp extract, Tomato, Gramineae pollens, Other, Peanut-containing drug products, Wheat, and Amoxicillin   Review of Systems Review of Systems: negative unless otherwise stated in HPI.      Physical Exam Triage Vital Signs ED Triage Vitals  Encounter Vitals Group     BP 06/25/24 0837 112/76     Girls Systolic BP Percentile --      Girls Diastolic BP Percentile --      Boys Systolic BP Percentile --      Boys Diastolic BP  Percentile --      Pulse Rate 06/25/24 0837 71     Resp 06/25/24 0837 15     Temp 06/25/24 0837 98.4 F (36.9 C)     Temp Source 06/25/24 0837 Oral     SpO2 06/25/24 0837 98 %     Weight 06/25/24 0835 205 lb 0.4 oz (93 kg)     Height 06/25/24 0835 6' (1.829 m)     Head Circumference --      Peak Flow --      Pain Score 06/25/24 0834 10     Pain Loc --      Pain Education --      Exclude from Growth Chart --    No data found.  Updated Vital Signs BP 112/76 (BP Location: Right Arm)   Pulse 71   Temp 98.4 F (36.9 C) (Oral)   Resp 15   Ht 6' (1.829 m)   Wt 93 kg   SpO2 98%   BMI 27.81 kg/m   Visual Acuity Right Eye Distance:   Left Eye Distance:   Bilateral Distance:    Right Eye Near:   Left Eye Near:    Bilateral Near:     Physical Exam GEN:     alert, non-toxic appearing male in no distress    HENT:  mucus membranes moist, oropharyngeal without lesions, moderate  erythema, no tonsillar hypertrophy or exudates,  moderate erythematous edematous turbinates, clear nasal discharge, bilateral TM normal EYES:   pupils equal and reactive, no scleral injection or discharge NECK:  normal ROM, no lymphadenopathy, no meningismus   RESP:  no increased work of breathing, clear to auscultation bilaterally CVS:   regular rate and rhythm Skin:   warm and dry, no rash on visible skin    UC Treatments / Results  Labs (all labs ordered are listed, but only abnormal results are displayed) Labs Reviewed  GROUP A STREP BY PCR  SARS CORONAVIRUS 2 BY RT PCR    EKG   Radiology No results found.   Procedures Procedures (including critical care time)  Medications Ordered in UC Medications - No data to display  Initial Impression / Assessment and Plan / UC Course  I have reviewed the triage vital signs and the nursing notes.  Pertinent labs & imaging results that were available during my care of the patient were reviewed by me and considered in my medical decision making  (see chart for details).       Pt is a 39 y.o. male who presents for 4 days of respiratory symptoms. Bruce Woodard is afebrile here. Satting well on room air. Overall pt is non-toxic appearing, well hydrated, without respiratory distress. Pulmonary exam is unremarkable.  COVID obtained and was negative. Strep PCR is negative. History consistent with viral respiratory illness. Discussed symptomatic treatment.  Explained  lack of efficacy of antibiotics in viral disease.  Atrovent  nasal spray for nasal congestion. Declined lidocaine  solution as he has some at home. Typical duration of symptoms discussed.   Return and ED precautions given and voiced understanding. Discussed MDM, treatment plan and plan for follow-up with patient who agrees with plan.     Final Clinical Impressions(s) / UC Diagnoses   Final diagnoses:  Viral URI     Discharge Instructions      Bruce Woodard Afzal's strep and COVID are all negative. You have a viral respiratory infection that will gradually improve over the next 7-10 days. Cough may last up to 3 weeks.    You can take Tylenol  and/or Ibuprofen  as needed for fever reduction and pain relief.    For cough: honey 1/2 to 1 teaspoon (you can dilute the honey in water or another fluid). You can also use guaifenesin and dextromethorphan for cough. You can use a humidifier for chest congestion and cough.  If you don'Woodard have a humidifier, you can sit in the bathroom with the hot shower running.      For sore throat: try warm salt water gargles, Mucinex sore throat cough drops or cepacol lozenges, throat spray, warm tea or water with lemon/honey, popsicles or ice, or OTC cold relief medicine for throat discomfort. You can also purchase chloraseptic spray at the pharmacy or dollar store.   For congestion: take a daily anti-histamine like Zyrtec, Claritin , and a oral decongestant, such as pseudoephedrine .  Pick up the prescription nasal spray at your pharmacy.    It is important to  stay hydrated: drink plenty of fluids (water, gatorade/powerade/pedialyte, juices, or teas) to keep your throat moisturized and help further relieve irritation/discomfort.    Return or go to the Emergency Department if symptoms worsen or do not improve in the next few days      ED Prescriptions     Medication Sig Dispense Auth. Provider   ipratropium (ATROVENT ) 0.06 % nasal spray Place 2 sprays into both nostrils 4 (four) times daily. 15 mL Ignace Mandigo, DO      PDMP not reviewed this encounter.   Emanuele Mcwhirter, DO 06/25/24 [phone]

## 2024-06-25 NOTE — Discharge Instructions (Addendum)
 Hogan T Bays's strep and COVID are all negative. You have a viral respiratory infection that will gradually improve over the next 7-10 days. Cough may last up to 3 weeks.    You can take Tylenol  and/or Ibuprofen  as needed for fever reduction and pain relief.    For cough: honey 1/2 to 1 teaspoon (you can dilute the honey in water or another fluid). You can also use guaifenesin and dextromethorphan for cough. You can use a humidifier for chest congestion and cough.  If you don't have a humidifier, you can sit in the bathroom with the hot shower running.      For sore throat: try warm salt water gargles, Mucinex sore throat cough drops or cepacol lozenges, throat spray, warm tea or water with lemon/honey, popsicles or ice, or OTC cold relief medicine for throat discomfort. You can also purchase chloraseptic spray at the pharmacy or dollar store.   For congestion: take a daily anti-histamine like Zyrtec, Claritin , and a oral decongestant, such as pseudoephedrine .  Pick up the prescription nasal spray at your pharmacy.    It is important to stay hydrated: drink plenty of fluids (water, gatorade/powerade/pedialyte, juices, or teas) to keep your throat moisturized and help further relieve irritation/discomfort.    Return or go to the Emergency Department if symptoms worsen or do not improve in the next few days

## 2024-06-30 ENCOUNTER — Encounter: Payer: 59 | Admitting: Nurse Practitioner

## 2024-07-05 ENCOUNTER — Encounter: Payer: Self-pay | Admitting: Emergency Medicine

## 2024-07-05 ENCOUNTER — Ambulatory Visit
Admission: EM | Admit: 2024-07-05 | Discharge: 2024-07-05 | Disposition: A | Discharge status: Home / Self Care | Attending: Emergency Medicine | Admitting: Emergency Medicine

## 2024-07-05 DIAGNOSIS — J069 Acute upper respiratory infection, unspecified: Secondary | ICD-10-CM | POA: Insufficient documentation

## 2024-07-05 LAB — GROUP A STREP BY PCR: Group A Strep by PCR: NOT DETECTED

## 2024-07-05 LAB — RESP PANEL BY RT-PCR (FLU A&B, COVID) ARPGX2
Influenza A by PCR: NEGATIVE
Influenza B by PCR: NEGATIVE
SARS Coronavirus 2 by RT PCR: NEGATIVE

## 2024-07-05 MED ORDER — ACETAMINOPHEN 325 MG PO TABS
975.0000 mg | ORAL_TABLET | Freq: Once | ORAL | Status: AC
  Administered 2024-07-05: 975 mg via ORAL

## 2024-07-05 MED ORDER — IPRATROPIUM BROMIDE 0.06 % NA SOLN: 2.0000 | Freq: Four times a day (QID) | NASAL | 0 refills | Status: DC

## 2024-07-05 MED ORDER — BENZONATATE 100 MG PO CAPS: 200.0000 mg | ORAL_CAPSULE | Freq: Three times a day (TID) | ORAL | 0 refills | Status: DC

## 2024-07-05 MED ORDER — PROMETHAZINE-DM 6.25-15 MG/5ML PO SYRP: 5.0000 mL | ORAL_SOLUTION | Freq: Four times a day (QID) | ORAL | 0 refills | Status: AC | PRN

## 2024-07-05 NOTE — ED Triage Notes (Addendum)
 Pt c/o upper abd burning, bodyaches,chills & congestion x2 days. Has tried alka seltzer w/o relief. Last dose around 0800. Had E-visit today for the same issue. Was given albuterol ,tessalon ,flonase & mucinex but only picked up albuterol .

## 2024-07-05 NOTE — ED Provider Notes (Signed)
 [facility]    CSN: [id number] Arrival date & time: 07/05/24  1101      History   Chief Complaint Chief Complaint  Patient presents with   Chills   Generalized Body Aches   Nasal Congestion    HPI Bruce Woodard is a 39 y.o. male.   HPI  38 year old male with past medical history significant for childhood asthma and GERD presents for evaluation of respiratory symptoms that started 3 to 4 days ago with nasal congestion and a runny nose.  She reports that the running has stopped and now he is just congested.  This is associated with bodyaches, chills, nonproductive cough, and shortness of breath.  He denies any fever, ear pain, wheezing, or headache.  He does endorse a mild sore throat.  Past Medical History:  Diagnosis Date   Allergy     Asthma    as a child   GERD (gastroesophageal reflux disease)    Mixed hyperlipidemia 05/23/2024   Preventative health care 06/26/2023    Patient Active Problem List   Diagnosis Date Noted   Mixed hyperlipidemia 05/23/2024   Loss of weight 09/25/2023   Early satiety 09/25/2023   Chest pain of uncertain etiology 09/01/2023   Preventative health care 06/26/2023   Epigastric abdominal pain 06/19/2023   Hiatal hernia with GERD 06/19/2023   Allergy , food 12/21/2019   Laceration of extensor tendon of hand 11/23/2015    Past Surgical History:  Procedure Laterality Date   BIOPSY  09/25/2023   Procedure: BIOPSY;  Surgeon: Jinny Carmine, MD;  Location: [facility];  Service: Endoscopy;;   COLONOSCOPY WITH PROPOFOL  N/A 09/25/2023   Procedure: COLONOSCOPY WITH PROPOFOL ;  Surgeon: Jinny Carmine, MD;  Location: [facility];  Service: Endoscopy;  Laterality: N/A;   ESOPHAGOGASTRODUODENOSCOPY (EGD) WITH PROPOFOL  N/A 09/25/2023   Procedure: ESOPHAGOGASTRODUODENOSCOPY (EGD) WITH PROPOFOL ;  Surgeon: Jinny Carmine, MD;  Location: [facility];  Service: Endoscopy;  Laterality: N/A;   REPAIR EXTENSOR TENDON Right 11/14/2015   Procedure:  REPAIR EXTENSOR TENDON, 3rd and 5th;  Surgeon: Norleen JINNY Maltos, MD;  Location: [facility];  Service: Orthopedics;  Laterality: Right;       Home Medications    Prior to Admission medications   Medication Sig Start Date End Date Taking? Authorizing Provider  benzonatate  (TESSALON ) 100 MG capsule Take 2 capsules (200 mg total) by mouth every 8 (eight) hours. 07/05/24  Yes Bernardino Ditch, NP  promethazine -dextromethorphan (PROMETHAZINE -DM) 6.25-15 MG/5ML syrup Take 5 mLs by mouth 4 (four) times daily as needed. 07/05/24  Yes Bernardino Ditch, NP  bismuth subsalicylate (PEPTO BISMOL) 262 MG/15ML suspension Take 30 mLs by mouth every 6 (six) hours as needed for indigestion.    [provider]  butalbital -acetaminophen -caffeine  (FIORICET) 50-325-40 MG tablet Take 1 tablet by mouth every 6 (six) hours as needed for headache. 05/24/24 05/24/25  Cleaster Tinnie LABOR, PA-C  cyclobenzaprine  (FLEXERIL ) 5 MG tablet Take 1 tablet (5 mg total) by mouth 3 (three) times daily as needed. 11/04/23   Menshew, Candida LULLA Kings, PA-C  ipratropium (ATROVENT ) 0.06 % nasal spray Place 2 sprays into both nostrils 4 (four) times daily. 07/05/24   Bernardino Ditch, NP  omeprazole  (PRILOSEC) 40 MG capsule Take 1 capsule (40 mg total) by mouth daily. Patient not taking: Reported on 09/16/2023 06/19/23   Gretel App, NP  rosuvastatin  (CRESTOR ) 10 MG tablet Take 1 tablet (10 mg total) by mouth daily. Patient not taking: Reported on 09/03/2023 06/27/23   Lester, Kacy, NP    Family  History Family History  Problem Relation Age of Onset   Hypertension Mother    Rheum arthritis Mother    Arthritis Mother    Diabetes Father    Depression Maternal Grandmother    Diabetes Maternal Grandmother    Diabetes Sister     Social History Social History   Tobacco Use   Smoking status: Never   Smokeless tobacco: Never  Vaping Use   Vaping status: Never Used  Substance Use Topics   Alcohol use: Not Currently    Comment: socially   Drug use:  No     Allergies   Peanut oil, Penicillins, Shellfish allergy , Shrimp extract, Tomato, Gramineae pollens, Other, Peanut-containing drug products, Wheat, and Amoxicillin   Review of Systems Review of Systems  Constitutional:  Negative for fever.  HENT:  Positive for congestion and sore throat. Negative for ear pain and rhinorrhea.   Eyes:  Negative for photophobia, discharge, redness, itching and visual disturbance.       Eyes feel heavy  Respiratory:  Positive for cough and shortness of breath. Negative for wheezing.   Gastrointestinal:  Positive for abdominal pain.       Soreness in the upper abdomen.  Musculoskeletal:  Positive for arthralgias and myalgias.  Neurological:  Negative for headaches.     Physical Exam Triage Vital Signs ED Triage Vitals  Encounter Vitals Group     BP      Girls Systolic BP Percentile      Girls Diastolic BP Percentile      Boys Systolic BP Percentile      Boys Diastolic BP Percentile      Pulse      Resp      Temp      Temp src      SpO2      Weight      Height      Head Circumference      Peak Flow      Pain Score      Pain Loc      Pain Education      Exclude from Growth Chart    No data found.  Updated Vital Signs BP 119/79 (BP Location: Right Arm)   Pulse 99   Temp (!) 100.4 F (38 C) (Oral)   Resp 16   Ht 6' (1.829 m)   Wt 205 lb 11.2 oz (93.3 kg)   SpO2 97%   BMI 27.90 kg/m   Visual Acuity Right Eye Distance:   Left Eye Distance:   Bilateral Distance:    Right Eye Near:   Left Eye Near:    Bilateral Near:     Physical Exam Vitals and nursing note reviewed.  Constitutional:      Appearance: Normal appearance. He is not ill-appearing.  HENT:     Head: Normocephalic and atraumatic.     Right Ear: Tympanic membrane, ear canal and external ear normal. There is no impacted cerumen.     Left Ear: Tympanic membrane, ear canal and external ear normal. There is no impacted cerumen.     Nose: Congestion and  rhinorrhea present.     Comments: Nasal mucosa is erythematous and edematous, greater on the left than the right.  Clear discharge noted in both nares.    Mouth/Throat:     Mouth: Mucous membranes are moist.     Pharynx: Oropharynx is clear. Posterior oropharyngeal erythema present. No oropharyngeal exudate.     Comments: Tonsillar pillars are erythematous and 1+ edematous  but no exudate. Cardiovascular:     Rate and Rhythm: Normal rate and regular rhythm.     Pulses: Normal pulses.     Heart sounds: Normal heart sounds. No murmur heard.    No friction rub. No gallop.  Pulmonary:     Effort: Pulmonary effort is normal.     Breath sounds: Normal breath sounds. No wheezing, rhonchi or rales.  Musculoskeletal:     Cervical back: Normal range of motion and neck supple. No tenderness.  Lymphadenopathy:     Cervical: No cervical adenopathy.  Skin:    General: Skin is warm and dry.     Capillary Refill: Capillary refill takes less than 2 seconds.     Findings: No rash.  Neurological:     General: No focal deficit present.     Mental Status: He is alert and oriented to person, place, and time.      UC Treatments / Results  Labs (all labs ordered are listed, but only abnormal results are displayed) Labs Reviewed  RESP PANEL BY RT-PCR (FLU A&B, COVID) [id number]  GROUP A STREP BY PCR    EKG   Radiology No results found.  Procedures Procedures (including critical care time)  Medications Ordered in UC Medications  acetaminophen  (TYLENOL ) tablet 975 mg (975 mg Oral Given 07/05/24 1208)    Initial Impression / Assessment and Plan / UC Course  I have reviewed the triage vital signs and the nursing notes.  Pertinent labs & imaging results that were available during my care of the patient were reviewed by me and considered in my medical decision making (see chart for details).   Patient is a nontoxic-appearing 38 year old male presenting for evaluation of flulike symptoms that began  3 to 4 days ago and have worsened over the last day.  He reports that prior to coming to urgent care he had an e-visit and was prescribed Tessalon  Perles, Flonase, Mucinex, and albuterol .  He did pick up the albuterol  but he has not taken it yet.  Reviewing the note the patient was diagnosed with an upper respiratory tract infection.  However, in that note the patient was complaining of a headache which she denies at present.  He also reports that one of his coworkers is currently sick with strep throat.  Coincidentally, the patient was also seen in this urgent care on 06/25/2024 for a sore throat and at that time had a negative strep PCR.  Differential diagnose include COVID, influenza, strep pharyngitis, viral illness.  The patient is currently febrile with an oral temp of 100.4.  I will order 975 mg of Tylenol  for his fever.  I will also order a COVID and flu PCR as well as a strep PCR.  Strep PCR is negative.  Respiratory panel is negative for COVID or influenza.  I will discharge patient on the diagnosis of viral URI with a cough.  He has an albuterol  inhaler which he can use for any shortness of breath or wheezing.  I will prescribe Atrovent  nasal spray for his nasal congestion along with Tessalon  Perles and Promethazine  DM cough syrup for cough and congestion.  Return precautions reviewed.  Work note provided.   Final Clinical Impressions(s) / UC Diagnoses   Final diagnoses:  Viral URI with cough     Discharge Instructions      Your testing today was negative for COVID, influenza, and strep.  Your physical exam does not feel that you have a viral upper respiratory tract infection.  Use the albuterol  inhaler that you are prescribed every 4-6 hours as needed for any shortness of breath or wheezing.  Continue to use over-the-counter Tylenol  and or ibuprofen  according to package instructions as needed for fever or pain.Use the Atrovent  nasal spray, 2 squirts in each nostril every 6 hours, as  needed for runny nose and postnasal drip.  Use the Tessalon  Perles every 8 hours during the day.  Take them with a small sip of water.  They may give you some numbness to the base of your tongue or a metallic taste in your mouth, this is normal.  Use the Promethazine  DM cough syrup at bedtime for cough and congestion.  It will make you drowsy so do not take it during the day.  Return for reevaluation or see your primary care provider for any new or worsening symptoms.      ED Prescriptions     Medication Sig Dispense Auth. Provider   ipratropium (ATROVENT ) 0.06 % nasal spray Place 2 sprays into both nostrils 4 (four) times daily. 15 mL Bernardino Ditch, NP   benzonatate  (TESSALON ) 100 MG capsule Take 2 capsules (200 mg total) by mouth every 8 (eight) hours. 21 capsule Bernardino Ditch, NP   promethazine -dextromethorphan (PROMETHAZINE -DM) 6.25-15 MG/5ML syrup Take 5 mLs by mouth 4 (four) times daily as needed. 118 mL Bernardino Ditch, NP      PDMP not reviewed this encounter.   Bernardino Ditch, NP 07/05/24 1236

## 2024-07-05 NOTE — Discharge Instructions (Signed)
 Your testing today was negative for COVID, influenza, and strep.  Your physical exam does not feel that you have a viral upper respiratory tract infection.  Use the albuterol  inhaler that you are prescribed every 4-6 hours as needed for any shortness of breath or wheezing.  Continue to use over-the-counter Tylenol  and or ibuprofen  according to package instructions as needed for fever or pain.Use the Atrovent  nasal spray, 2 squirts in each nostril every 6 hours, as needed for runny nose and postnasal drip.  Use the Tessalon  Perles every 8 hours during the day.  Take them with a small sip of water.  They may give you some numbness to the base of your tongue or a metallic taste in your mouth, this is normal.  Use the Promethazine  DM cough syrup at bedtime for cough and congestion.  It will make you drowsy so do not take it during the day.  Return for reevaluation or see your primary care provider for any new or worsening symptoms.

## 2024-07-07 NOTE — ED Provider Notes (Signed)
 [facility] Emergency Department Provider Note    ED Clinical Impression   Final diagnoses:  Viral URI with cough (Primary)    ED Assessment/Plan  This is a 38 y.o. male who presents with 2 days of persistent congestion and shortness of breath since Monday (07/05/24) as described below.   Vitals are within normal limits. On exam, the patient appears nontoxic, in no acute distress. Normal cardiopulmonary exam. No cervical lymphadenopathy. Abdomen soft, non-tender. No LE edema/calf tenderness.   Ddx includes likely viral URI, no evidence of pneumonia on CXR, unlikely cardiac etiology or PE given history and exam.   RPP negative. Plan for discharge with supportive care. All questions and concerns addressed. Usual and customary return precautions given.     Discussion of Management with other Physicians, QHP or Appropriate Source:  N/A Independent Interpretation of Studies: EKG n/a; RAD CXR- no focal consolidation, POCUS: N/A External Records Reviewed: Patient's most recent outpatient clinic note - [facility] note from 07/05/24 to review course. Escalation of Care, Consideration of Admission/Observation/Transfer:  N/A Social determinants that significantly affected care: None applicable Prescription drug(s) considered but not prescribed: None indicated Diagnostic tests considered but not performed: None indicated History obtained from other sources: None     History   Chief Complaint  Patient presents with  . Flu Like Symptoms    Bruce Woodard is a 38 y.o. male with a history of childhood asthma and GERD who presents with flu-like symptoms. The patient reports 2 days of persistent congestion and shortness of breath since Monday (07/05/24). Additionally, he had a fever (100.65F) Monday, but none since then.. The patient had a recent negative 4plex at [facility] on Monday at which time he was told likely URI. He reports being around potential sick contacts at work. Took  mucinex, alka seltzer, and albuterol  inhaler without relief. No history of asthma. No smoking history. No recent travel. No LE edema. Denies headache, sore throat, abdominal pain, nausea, CP. emesis, diarrhea, or rashes/sores.   Past Medical History[1]  Past Surgical History[2]  Family History[3]  Social History[4]  Current Medications[5]  Review of Systems Constitutional: Negative for fever. Eyes: Negative for visual changes. ENT: Negative for sore throat. Positive for congestion. Cardiovascular: Negative for chest pain. Respiratory: Positive for shortness of breath and cough. Gastrointestinal: Negative for abdominal pain, vomiting or diarrhea. Genitourinary: Negative for dysuria or hematuria. Musculoskeletal: Negative for back pain, LE edema or calf pain. Skin: Negative for rash. Neurological: Negative for headaches, weakness or numbness.  Physical Exam   BP 130/86   Pulse 75   Temp 36.8 C (98.2 F) (Temporal)   Resp 18   SpO2 99%   Constitutional: Alert and oriented. Nontoxic appearing and in no distress. Eyes: Conjunctivae are normal. ENT      Head: Normocephalic and atraumatic.      Nose: No congestion.      Mouth/Throat: Mucous membranes are moist.      Neck: No stridor, neck is supple. Hematological/Lymphatic/Immunilogical: No cervical lymphadenopathy. Cardiovascular: Normal rate, regular rhythm. No m/g/r. Normal and symmetric distal pulses are present in all extremities.  Respiratory: Normal respiratory effort. Breath sounds are normal. No wheezes, rales, rhonchi. Gastrointestinal: Soft and nontender. There is no CVA tenderness. Genitourinary: Deferred. Musculoskeletal: Nontender with normal range of motion in all extremities.      Right lower leg: No tenderness or edema.      Left lower leg: No tenderness or edema. Neurologic: Normal speech and language. No gross focal neurologic  deficits are appreciated.  Skin: Skin is warm, dry and intact. No rash  noted. Psychiatric: Mood and affect are normal. Speech and behavior are normal.     Documentation assistance was provided by Lamarr Edison, Scribe, on July 07, 2024 at 8:50 AM for Loa Pinal, MD.   July 07, 2024 8:38 AM. Documentation assistance provided by the scribe. I was present during the time the encounter was recorded. The information recorded by the scribe was done at my direction and has been reviewed and validated by me.        [1] No past medical history on file. [2] Past Surgical History: Procedure Laterality Date  . TENDON REPAIR     right hand  [3] History reviewed. No pertinent family history. [4] Social History Socioeconomic History  . Marital status: Married    Spouse name: None  . Number of children: None  . Years of education: None  . Highest education level: None  Tobacco Use  . Smoking status: Never  . Smokeless tobacco: Never  Vaping Use  . Vaping status: Never Used  Substance and Sexual Activity  . Alcohol use: Yes  . Drug use: Never   Social Drivers of Corporate investment banker Strain: Low Risk  (10/06/2023)   Received from [facility]   Overall Financial Resource Strain (CARDIA)   . Difficulty of Paying Living Expenses: Not hard at all  Food Insecurity: No Food Insecurity (10/06/2023)   Received from [facility]   Hunger Vital Sign   . Within the past 12 months, you worried that your food would run out before you got the money to buy more.: Never true   . Within the past 12 months, the food you bought just didn't last and you didn't have money to get more.: Never true  Transportation Needs: No Transportation Needs (10/06/2023)   Received from [facility] - Transportation   . Lack of Transportation (Medical): No   . Lack of Transportation (Non-Medical): No  Physical Activity: Unknown (10/06/2023)   Received from [facility]   Exercise Vital Sign   . On average, how many days per week do you engage in moderate to  strenuous exercise (like a brisk walk)?: 0 days  Stress: No Stress Concern Present (10/06/2023)   Received from [facility] - Occupational Stress Questionnaire   . Feeling of Stress : Not at all  Social Connections: Moderately Isolated (10/06/2023)   Received from [facility]   Social Connection and Isolation Panel   . In a typical week, how many times do you talk on the phone with family, friends, or neighbors?: Once a week   . How often do you get together with friends or relatives?: Once a week   . How often do you attend church or religious services?: 1 to 4 times per year   . Do you belong to any clubs or organizations such as church groups, unions, fraternal or athletic groups, or school groups?: No   . Are you married, widowed, divorced, separated, never married, or living with a partner?: Married  [5] No current facility-administered medications for this encounter.   Current Outpatient Medications  Medication Sig Dispense Refill  . diphenhydrAMINE  (BENADRYL ) 50 mg capsule Take 50 mg by mouth every six (6) hours as needed for itching.    . EPINEPHrine  (EPIPEN ) 0.3 mg/0.3 mL (1:1,000) injection Inject 0.3 mL (0.3 mg total) into the muscle once. for 1 dose  0.3 mL 0  . famotidine  (PEPCID ) 20 MG tablet Take 1 tablet (20 mg total) by mouth two (2) times a day for 15 days. 30 tablet 0  . fluticasone propionate (FLONASE) 50 mcg/actuation nasal spray 2 sprays into each nostril.    . lidocaine  2% viscous (XYLOCAINE ) 2 % Soln 15 mL by Mouth route every three (3) hours. 100 mL 0  . loratadine  (CLARITIN ) 10 mg tablet Take 1 tablet (10 mg total) by mouth daily.    . methylPREDNISolone  (MEDROL  DOSEPACK) 4 mg tablet follow package directions 1 each 0   Miller, Diane Lee, MD 07/08/24 2159

## 2024-07-08 DIAGNOSIS — J302 Other seasonal allergic rhinitis: Secondary | ICD-10-CM | POA: Insufficient documentation

## 2024-07-11 ENCOUNTER — Ambulatory Visit
Admission: EM | Admit: 2024-07-11 | Discharge: 2024-07-11 | Disposition: A | Discharge status: Home / Self Care | Attending: Family Medicine | Admitting: Family Medicine

## 2024-07-11 ENCOUNTER — Ambulatory Visit (INDEPENDENT_AMBULATORY_CARE_PROVIDER_SITE_OTHER)

## 2024-07-11 ENCOUNTER — Encounter: Payer: Self-pay | Admitting: Emergency Medicine

## 2024-07-11 DIAGNOSIS — J22 Unspecified acute lower respiratory infection: Secondary | ICD-10-CM

## 2024-07-11 DIAGNOSIS — J302 Other seasonal allergic rhinitis: Secondary | ICD-10-CM

## 2024-07-11 MED ORDER — AZITHROMYCIN 250 MG PO TABS: ORAL_TABLET | ORAL | 0 refills | Status: DC

## 2024-07-11 MED ORDER — FEXOFENADINE-PSEUDOEPHED ER 60-120 MG PO TB12: 1.0000 | ORAL_TABLET | Freq: Two times a day (BID) | ORAL | 0 refills | Status: AC

## 2024-07-11 NOTE — Discharge Instructions (Addendum)
 Your chest xray did not show evidence of pneumonia. Stop by the pharmacy to pick up your prescriptions.  Follow up with your primary care provider or return to the urgent care, if not improving.

## 2024-07-11 NOTE — ED Triage Notes (Signed)
 Pt c/o cough, shortness of breath in the mornings, congestion. He states he had a fever about a week ago. Negative for covid flu on 07/05/24. Requesting chest xray.

## 2024-07-11 NOTE — ED Provider Notes (Signed)
 [facility]    CSN: [id number] Arrival date & time: 07/11/24  1326      History   Chief Complaint Chief Complaint  Patient presents with   Cough   Shortness of Breath    HPI Bruce Woodard is a 38 y.o. male.   HPI  History obtained from the patient. Bruce Woodard presents for shortness of breath that started this morning. Has persistent dry cough and nasal congestion for over a week.  No fever, vomiting, headache or belly pain. Endorses some medication induced diarrhea.  Took prednisone , albuterol , Mucinex, Robitussin and AlkaSeltzer.  Patient tried to do a video visit but was told he needed a chest xray so he came here.   No history of asthma. Denies vaping and smoking.       Past Medical History:  Diagnosis Date   Allergy     Asthma    as a child   GERD (gastroesophageal reflux disease)    Mixed hyperlipidemia 05/23/2024   Preventative health care 06/26/2023    Patient Active Problem List   Diagnosis Date Noted   Mixed hyperlipidemia 05/23/2024   Loss of weight 09/25/2023   Early satiety 09/25/2023   Chest pain of uncertain etiology 09/01/2023   Preventative health care 06/26/2023   Epigastric abdominal pain 06/19/2023   Hiatal hernia with GERD 06/19/2023   Allergy , food 12/21/2019   Laceration of extensor tendon of hand 11/23/2015    Past Surgical History:  Procedure Laterality Date   BIOPSY  09/25/2023   Procedure: BIOPSY;  Surgeon: Jinny Carmine, MD;  Location: [facility] ENDOSCOPY;  Service: Endoscopy;;   COLONOSCOPY WITH PROPOFOL  N/A 09/25/2023   Procedure: COLONOSCOPY WITH PROPOFOL ;  Surgeon: Jinny Carmine, MD;  Location: [facility] ENDOSCOPY;  Service: Endoscopy;  Laterality: N/A;   ESOPHAGOGASTRODUODENOSCOPY (EGD) WITH PROPOFOL  N/A 09/25/2023   Procedure: ESOPHAGOGASTRODUODENOSCOPY (EGD) WITH PROPOFOL ;  Surgeon: Jinny Carmine, MD;  Location: [facility] ENDOSCOPY;  Service: Endoscopy;  Laterality: N/A;   REPAIR EXTENSOR TENDON Right 11/14/2015   Procedure: REPAIR  EXTENSOR TENDON, 3rd and 5th;  Surgeon: Norleen JINNY Maltos, MD;  Location: [facility] ORS;  Service: Orthopedics;  Laterality: Right;       Home Medications    Prior to Admission medications   Medication Sig Start Date End Date Taking? Authorizing Provider  azithromycin  (ZITHROMAX  Z-PAK) 250 MG tablet Take 2 tablets on day 1 then 1 tablet daily 07/11/24  Yes Madylin Fairbank, DO  fexofenadine -pseudoephedrine  (ALLEGRA-D) 60-120 MG 12 hr tablet Take 1 tablet by mouth every 12 (twelve) hours. 07/11/24  Yes Jaycelynn Knickerbocker, DO  bismuth subsalicylate (PEPTO BISMOL) 262 MG/15ML suspension Take 30 mLs by mouth every 6 (six) hours as needed for indigestion.    [provider]  butalbital -acetaminophen -caffeine  (FIORICET) 50-325-40 MG tablet Take 1 tablet by mouth every 6 (six) hours as needed for headache. 05/24/24 05/24/25  Cleaster Tinnie LABOR, PA-C  cyclobenzaprine  (FLEXERIL ) 5 MG tablet Take 1 tablet (5 mg total) by mouth 3 (three) times daily as needed. 11/04/23   Menshew, Candida LULLA Kings, PA-C  ipratropium (ATROVENT ) 0.06 % nasal spray Place 2 sprays into both nostrils 4 (four) times daily. 07/05/24   Bernardino Ditch, NP  promethazine -dextromethorphan (PROMETHAZINE -DM) 6.25-15 MG/5ML syrup Take 5 mLs by mouth 4 (four) times daily as needed. 07/05/24   Bernardino Ditch, NP  rosuvastatin  (CRESTOR ) 10 MG tablet Take 1 tablet (10 mg total) by mouth daily. Patient not taking: Reported on 09/03/2023 06/27/23   Gretel App, NP    Family History Family History  Problem Relation Age of Onset   Hypertension Mother    Rheum arthritis Mother    Arthritis Mother    Diabetes Father    Depression Maternal Grandmother    Diabetes Maternal Grandmother    Diabetes Sister     Social History Social History   Tobacco Use   Smoking status: Never   Smokeless tobacco: Never  Vaping Use   Vaping status: Never Used  Substance Use Topics   Alcohol use: Not Currently    Comment: socially   Drug use: No     Allergies    Peanut oil, Penicillins, Shellfish allergy , Shrimp extract, Tomato, Gramineae pollens, Other, Peanut-containing drug products, Wheat, and Amoxicillin   Review of Systems Review of Systems: negative unless otherwise stated in HPI.      Physical Exam Triage Vital Signs ED Triage Vitals  Encounter Vitals Group     BP 07/11/24 1343 129/83     Girls Systolic BP Percentile --      Girls Diastolic BP Percentile --      Boys Systolic BP Percentile --      Boys Diastolic BP Percentile --      Pulse Rate 07/11/24 1343 72     Resp 07/11/24 1343 18     Temp 07/11/24 1343 98.2 F (36.8 C)     Temp Source 07/11/24 1343 Oral     SpO2 07/11/24 1343 96 %     Weight 07/11/24 1342 205 lb 11 oz (93.3 kg)     Height 07/11/24 1342 6' (1.829 m)     Head Circumference --      Peak Flow --      Pain Score --      Pain Loc --      Pain Education --      Exclude from Growth Chart --    No data found.  Updated Vital Signs BP 129/83 (BP Location: Right Arm)   Pulse 72   Temp 98.2 F (36.8 C) (Oral)   Resp 18   Ht 6' (1.829 m)   Wt 93.3 kg   SpO2 96%   BMI 27.90 kg/m   Visual Acuity Right Eye Distance:   Left Eye Distance:   Bilateral Distance:    Right Eye Near:   Left Eye Near:    Bilateral Near:     Physical Exam GEN:     alert, non-toxic appearing male in no distress    HENT:  mucus membranes moist, no nasal discharge EYES:   no scleral injection or discharge NECK:  normal ROM RESP:  no increased work of breathing, clear to auscultation bilaterally CVS:   regular rate and rhythm Skin:   warm and dry, no rash on visible skin    UC Treatments / Results  Labs (all labs ordered are listed, but only abnormal results are displayed) Labs Reviewed - No data to display  EKG   Radiology DG Chest 2 View Result Date: 07/11/2024 CLINICAL DATA:  Cough and congestion with fever 1 week. EXAM: CHEST - 2 VIEW COMPARISON:  07/01/2023 FINDINGS: Lungs are adequately inflated without  focal airspace consolidation or effusion. Cardiomediastinal silhouette and remainder of the exam is unchanged. IMPRESSION: No active cardiopulmonary disease. Electronically Signed   By: Toribio Agreste M.D.   On: 07/11/2024 13:59     Procedures Procedures (including critical care time)  Medications Ordered in UC Medications - No data to display  Initial Impression / Assessment and Plan / UC Course  I have  reviewed the triage vital signs and the nursing notes.  Pertinent labs & imaging results that were available during my care of the patient were reviewed by me and considered in my medical decision making (see chart for details).      Pt is a 38 y.o. male who presents for 1-2 weeks of cough that is not improving.  Bruce Woodard is afebrile here without recent antipyretics. Satting well on room air. Overall pt is  non-toxic appearing, well hydrated, without respiratory distress. Pulmonary exam is unremarkable  except for dry cough.  After shared decision making, we will pursue chest x-ray.  COVID  and influenza testing deferred due to length of symptoms.   Chest xray personally reviewed by me without focal pneumonia, pleural effusion, cardiomegaly or pneumothorax. Radiologist impression reviewed.    Treat acute bronchitis with antibiotics as below. Allergra-D for nasal congestion.  Typical duration of symptoms discussed. Return and ED precautions given and patient voiced understanding.   Discussed MDM, treatment plan and plan for follow-up with patient who agrees with plan.     Final Clinical Impressions(s) / UC Diagnoses   Final diagnoses:  Acute lower respiratory infection  Seasonal allergies     Discharge Instructions      Your chest xray did not show evidence of pneumonia. Stop by the pharmacy to pick up your prescriptions.  Follow up with your primary care provider or return to the urgent care, if not improving.           ED Prescriptions     Medication Sig Dispense  Auth. Provider   azithromycin  (ZITHROMAX  Z-PAK) 250 MG tablet Take 2 tablets on day 1 then 1 tablet daily 6 tablet Vidal Lampkins, DO   fexofenadine -pseudoephedrine  (ALLEGRA-D) 60-120 MG 12 hr tablet Take 1 tablet by mouth every 12 (twelve) hours. 30 tablet Irene Collings, DO      PDMP not reviewed this encounter.   Kriste Berth, DO 07/11/24 1406

## 2024-07-14 LAB — LAB REPORT - SCANNED: HM Hepatitis Screen: NEGATIVE

## 2024-07-17 ENCOUNTER — Other Ambulatory Visit: Payer: Self-pay

## 2024-07-17 ENCOUNTER — Emergency Department

## 2024-07-17 ENCOUNTER — Emergency Department
Admission: EM | Admit: 2024-07-17 | Discharge: 2024-07-17 | Disposition: A | Discharge status: Home / Self Care | Attending: Emergency Medicine | Admitting: Emergency Medicine

## 2024-07-17 DIAGNOSIS — J45909 Unspecified asthma, uncomplicated: Secondary | ICD-10-CM | POA: Diagnosis not present

## 2024-07-17 DIAGNOSIS — R0602 Shortness of breath: Secondary | ICD-10-CM | POA: Diagnosis present

## 2024-07-17 DIAGNOSIS — Z5321 Procedure and treatment not carried out due to patient leaving prior to being seen by health care provider: Secondary | ICD-10-CM | POA: Diagnosis not present

## 2024-07-17 NOTE — ED Notes (Signed)
 Attempted blood draw at this time, pt asked this tech Are you new and asked to remove the butterfly needle and stop blood draw. Informed pt lab would have to come get a blood draw and pt agreed at this time.

## 2024-07-17 NOTE — ED Triage Notes (Signed)
 Pt to ED via POV for SOB. Pt stating was diagnosed with URI. Finished abx yesterday and prednisone  today, feels like something stuck in L lung. Is having some SOB, no accessory muscle use O2 at 100%. Even unlabored respirations.

## 2024-07-17 NOTE — ED Notes (Signed)
 This RN to bedside to answer call bell. Pt advised he as leaving and will see his PCP on Monday. Pt was educated on staying as he  had a cough and SOB. Pt stated I have been seen multiple times for URI and aren't getting any better. This RN reviewd his chart. Per pt he has not picked up any of his cough meds prescribed. Pt states I will go get those today.

## 2024-07-17 NOTE — ED Provider Notes (Signed)
   [facility] Provider Note    Event Date/Time   First MD Initiated Contact with Patient 07/17/24 1625     (approximate)   History   Shortness of Breath   HPIQuincey T Tremont is a 38 y.o. male history of asthma and reflux was seen [facility] on September 21 for upper respiratory infection.  Was then also seen for epigastric evaluation on September 24    Vitals:   07/17/24 1556  BP: (!) 131/95  Pulse: 79  Resp: 18  SpO2: 100%     ----------------------------------------- 4:30 PM on 07/17/2024 ----------------------------------------- I went to go see the patient, and nurse Crystal notified me that patient noted that he was going to leave.  He was in no distress.  She encouraged him to stay for evaluation but he did not.  It is not clear to me why he left.  I did not make any contact with him and he had not been in the ER for a prolonged period of time either.   Eloped. [Left without being seen by me]   Dicky Anes, MD 07/17/24 [phone]

## 2024-08-25 ENCOUNTER — Other Ambulatory Visit: Payer: Self-pay

## 2024-08-27 NOTE — Progress Notes (Deleted)
 08/27/2024 TAESEAN RETH [id number] 11-Aug-1986  Gastroenterology Office Note     Primary Care Physician:  Gretel App, NP  Primary GI Provider: Jinny Carmine, MD    Chief Complaint   No chief complaint on file.    History of Present Illness   Bruce Woodard is a 38 y.o. male with PMHX of *** , presenting today   Patient last seen by Dr. Jinny on 05/01/2023 with history of rectal bleeding, early satiety, abdominal pain that is in the left upper quadrant and goes down to the left lower quadrant with intermittent pains on the right mid epigastric area .   09/25/2023 colonoscopy - The entire examined colon is normal. - The examined portion of the ileum was normal. Biopsied. - Repeat in 10 years  Upper endoscopy - Normal esophagus, stomach, and duodenum UNREMARKABLE DUODENAL MUCOSA. NEGATIVE FOR FEATURES OF CELIAC, DYSPLASIA, AND MALIGNANCY.  - UNREMARKABLE GASTRIC MUCOSA. NEGATIVE FOR H. PYLORI, INTESTINAL METAPLASIA, DYSPLASIA, AND MALIGNANCY.    Past Medical History:  Diagnosis Date   Allergy     Asthma    as a child   GERD (gastroesophageal reflux disease)    Mixed hyperlipidemia 05/23/2024   Preventative health care 06/26/2023   Seasonal allergies 07/08/2024    Past Surgical History:  Procedure Laterality Date   BIOPSY  09/25/2023   Procedure: BIOPSY;  Surgeon: Jinny Carmine, MD;  Location: [facility];  Service: Endoscopy;;   COLONOSCOPY WITH PROPOFOL  N/A 09/25/2023   Procedure: COLONOSCOPY WITH PROPOFOL ;  Surgeon: Jinny Carmine, MD;  Location: [facility];  Service: Endoscopy;  Laterality: N/A;   ESOPHAGOGASTRODUODENOSCOPY (EGD) WITH PROPOFOL  N/A 09/25/2023   Procedure: ESOPHAGOGASTRODUODENOSCOPY (EGD) WITH PROPOFOL ;  Surgeon: Jinny Carmine, MD;  Location: [facility];  Service: Endoscopy;  Laterality: N/A;   REPAIR EXTENSOR TENDON Right 11/14/2015   Procedure: REPAIR EXTENSOR TENDON, 3rd and 5th;  Surgeon: Norleen JINNY Maltos, MD;  Location: [facility];   Service: Orthopedics;  Laterality: Right;    Current Outpatient Medications  Medication Sig Dispense Refill   azithromycin  (ZITHROMAX  Z-PAK) 250 MG tablet Take 2 tablets on day 1 then 1 tablet daily 6 tablet 0   bismuth subsalicylate (PEPTO BISMOL) 262 MG/15ML suspension Take 30 mLs by mouth every 6 (six) hours as needed for indigestion.     butalbital -acetaminophen -caffeine  (FIORICET) 50-325-40 MG tablet Take 1 tablet by mouth every 6 (six) hours as needed for headache. 10 tablet 0   famotidine  (PEPCID ) 20 MG tablet Take by mouth.     fexofenadine -pseudoephedrine  (ALLEGRA-D) 60-120 MG 12 hr tablet Take 1 tablet by mouth every 12 (twelve) hours. 30 tablet 0   ipratropium (ATROVENT ) 0.06 % nasal spray Place 2 sprays into both nostrils 4 (four) times daily. 15 mL 0   promethazine -dextromethorphan (PROMETHAZINE -DM) 6.25-15 MG/5ML syrup Take 5 mLs by mouth 4 (four) times daily as needed. 118 mL 0   rosuvastatin  (CRESTOR ) 10 MG tablet Take 1 tablet (10 mg total) by mouth daily. (Patient not taking: Reported on 09/03/2023) 90 tablet 3   No current facility-administered medications for this visit.    Allergies as of 08/30/2024 - Review Complete 07/17/2024  Allergen Reaction Noted   Peanut oil Anaphylaxis and Rash 01/30/2019   Penicillins Anaphylaxis, Swelling, and Other (See Comments) 09/25/2017   Shellfish allergy  Anaphylaxis and Other (See Comments) 02/10/2017   Shrimp extract Anaphylaxis 09/07/2020   Tomato Anaphylaxis and Other (See Comments) 01/30/2019   Gramineae pollens  01/30/2019   Other  01/30/2019   Peanut-containing drug products  01/30/2019   Wheat  09/16/2023   Amoxicillin Swelling 11/02/2015    Family History  Problem Relation Age of Onset   Hypertension Mother    Rheum arthritis Mother    Arthritis Mother    Diabetes Father    Depression Maternal Grandmother    Diabetes Maternal Grandmother    Diabetes Sister     Social History   Socioeconomic History   Marital  status: Married    Spouse name: Not on file   Number of children: Not on file   Years of education: Not on file   Highest education level: 12th grade  Occupational History   Not on file  Tobacco Use   Smoking status: Never   Smokeless tobacco: Never  Vaping Use   Vaping status: Never Used  Substance and Sexual Activity   Alcohol use: Not Currently    Comment: socially   Drug use: No   Sexual activity: Yes    Birth control/protection: None    Comment: Last encounter: 14th, Unprotected with wife.  Other Topics Concern   Not on file  Social History Narrative   Not on file   Social Drivers of Health   Financial Resource Strain: Low Risk  (10/06/2023)   Overall Financial Resource Strain (CARDIA)    Difficulty of Paying Living Expenses: Not hard at all  Food Insecurity: No Food Insecurity (10/06/2023)   Hunger Vital Sign    Worried About Running Out of Food in the Last Year: Never true    Ran Out of Food in the Last Year: Never true  Transportation Needs: No Transportation Needs (10/06/2023)   PRAPARE - Administrator, Civil Service (Medical): No    Lack of Transportation (Non-Medical): No  Physical Activity: Unknown (10/06/2023)   Exercise Vital Sign    Days of Exercise per Week: 0 days    Minutes of Exercise per Session: Not on file  Stress: No Stress Concern Present (10/06/2023)   [employer] of Occupational Health - Occupational Stress Questionnaire    Feeling of Stress : Not at all  Social Connections: Moderately Isolated (10/06/2023)   Social Connection and Isolation Panel    Frequency of Communication with Friends and Family: Once a week    Frequency of Social Gatherings with Friends and Family: Once a week    Attends Religious Services: 1 to 4 times per year    Active Member of [employer] or Organizations: No    Attends Engineer, Structural: Not on file    Marital Status: Married  Catering Manager Violence: Not on file     RELEVANT GI  HISTORY, IMAGING AND LABS: CBC    Component Value Date/Time   WBC 6.5 11/04/2023 1331   RBC 4.66 11/04/2023 1331   HGB 15.0 11/04/2023 1331   HCT 42.1 11/04/2023 1331   PLT 355 11/04/2023 1331   MCV 90.3 11/04/2023 1331   MCH 32.2 11/04/2023 1331   MCHC 35.6 11/04/2023 1331   RDW 12.4 11/04/2023 1331   LYMPHSABS 2.7 06/26/2023 0814   MONOABS 0.6 06/26/2023 0814   EOSABS 0.2 06/26/2023 0814   BASOSABS 0.1 06/26/2023 0814   Recent Labs    11/04/23 1331  HGB 15.0    CMP     Component Value Date/Time   NA 137 11/04/2023 1331   K 3.7 11/04/2023 1331   CL 103 11/04/2023 1331   CO2 27 11/04/2023 1331   GLUCOSE 95 11/04/2023 1331   BUN 11 11/04/2023 1331  CREATININE 1.13 11/04/2023 1331   CALCIUM  9.1 11/04/2023 1331   PROT 7.6 11/04/2023 1331   ALBUMIN 4.3 11/04/2023 1331   AST 27 11/04/2023 1331   ALT 36 11/04/2023 1331   ALKPHOS 83 11/04/2023 1331   BILITOT 0.7 11/04/2023 1331   GFRNONAA >60 11/04/2023 1331   GFRAA >60 05/22/2019 2350      Latest Ref Rng & Units 11/04/2023    1:31 PM 06/26/2023    8:14 AM 05/03/2023   11:00 AM  Hepatic Function  Total Protein 6.5 - 8.1 g/dL 7.6  7.5  7.6   Albumin 3.5 - 5.0 g/dL 4.3  4.4  4.2   AST 15 - 41 U/L 27  22  21    ALT 0 - 44 U/L 36  43  27   Alk Phosphatase 38 - 126 U/L 83  107  83   Total Bilirubin 0.0 - 1.2 mg/dL 0.7  0.3  0.5       Review of Systems   All systems reviewed and negative except where noted in HPI.    Physical Exam  There were no vitals taken for this visit. No LMP for male patient. General:   Alert and oriented. Pleasant and cooperative. Well-nourished and well-developed.  Head:  Normocephalic and atraumatic. Eyes:  Without icterus Ears:  Normal auditory acuity. Neck:  Supple; no masses or thyromegaly. Lungs:  Respirations even and unlabored.  Clear throughout to auscultation.   No wheezes, crackles, or rhonchi. No acute distress. Heart:  Regular rate and rhythm; no murmurs, clicks, rubs, or  gallops. Abdomen:  Normal bowel sounds.  No bruits.  Soft, non-tender and non-distended without masses, hepatosplenomegaly or hernias noted.  No guarding or rebound tenderness.  ***Negative Carnett sign.   Rectal:  Deferred.***  Msk:  Symmetrical without gross deformities. Normal posture. Extremities:  Without edema. Neurologic:  Alert and  oriented x4;  grossly normal neurologically. Skin:  Intact without significant lesions or rashes. Psych:  Alert and cooperative. Normal mood and affect.   Assessment & Plan   CAMILO MANDER is a 38 y.o. male presenting today with     Grayce Bohr, DNP, AGNP-C [address] [facility]  Gastroenterology

## 2024-08-30 ENCOUNTER — Ambulatory Visit: Payer: Self-pay | Admitting: Family Medicine

## 2024-09-22 ENCOUNTER — Encounter: Payer: Self-pay | Admitting: Nurse Practitioner

## 2024-11-03 ENCOUNTER — Encounter: Payer: Self-pay | Admitting: Emergency Medicine

## 2024-11-03 ENCOUNTER — Ambulatory Visit
Admission: EM | Admit: 2024-11-03 | Discharge: 2024-11-03 | Disposition: A | Discharge status: Home / Self Care | Attending: Emergency Medicine | Admitting: Emergency Medicine

## 2024-11-03 DIAGNOSIS — J029 Acute pharyngitis, unspecified: Secondary | ICD-10-CM | POA: Diagnosis not present

## 2024-11-03 DIAGNOSIS — J069 Acute upper respiratory infection, unspecified: Secondary | ICD-10-CM | POA: Insufficient documentation

## 2024-11-03 LAB — POCT RAPID STREP A (OFFICE): Rapid Strep A Screen: NEGATIVE

## 2024-11-03 MED ORDER — IPRATROPIUM BROMIDE 0.06 % NA SOLN: 2.0000 | Freq: Four times a day (QID) | NASAL | 0 refills | Status: AC

## 2024-11-03 NOTE — ED Triage Notes (Signed)
 Patient states that he's has a sore throat for 2 days. No other sx. Exposure to flu through his job. No fever,headache or bodyaches

## 2024-11-03 NOTE — Discharge Instructions (Addendum)
 Your strep test was negative but we will send the swab for culture.  I suspect that you have a viral respiratory infection based upon your nasal congestion and sore throat.  It could possibly be influenza but you are not infectious due to the fact that you are not febrile.  Use over-the-counter Tylenol  and or ibuprofen  according to the package instructions as needed for any fever or pain.  Use the Atrovent  nasal spray, 2 squirts up each nostril every 6 hours, as needed for nasal congestion.  You may gargle with warm salt water as often as you like to soothe your throat.  You may also use over-the-counter Chloraseptic or Sucrets lozenges.  No more than 1 lozenge every 2 hours as the menthol may give you diarrhea.  If you develop any new or worsening close please return for reevaluation or see your PCP

## 2024-11-03 NOTE — ED Provider Notes (Addendum)
 " [facility]    CSN: [id number] Arrival date & time: 11/03/24  1310      History   Chief Complaint Chief Complaint  Patient presents with   Sore Throat    HPI Bruce Woodard is a 39 y.o. male.   HPI  39 year old male with past medical history significant for seasonal allergies, mixed hyperlipidemia, asthma as a child, and GERD presents for evaluation of 2 days worth of nasal congestion and sore throat.  He denies any runny nose, ear pain, or cough.  He reports that his boss had similar symptoms and was diagnosed with a sinus infection.  He has had other coworkers who have tested positive for influenza.  Past Medical History:  Diagnosis Date   Allergy     Asthma    as a child   GERD (gastroesophageal reflux disease)    Mixed hyperlipidemia 05/23/2024   Preventative health care 06/26/2023   Seasonal allergies 07/08/2024    Patient Active Problem List   Diagnosis Date Noted   Mixed hyperlipidemia 05/23/2024   Loss of weight 09/25/2023   Early satiety 09/25/2023   Chest pain of uncertain etiology 09/01/2023   Preventative health care 06/26/2023   Epigastric abdominal pain 06/19/2023   Hiatal hernia with GERD 06/19/2023   Laceration of extensor tendon of hand 11/23/2015    Past Surgical History:  Procedure Laterality Date   BIOPSY  09/25/2023   Procedure: BIOPSY;  Surgeon: Jinny Carmine, MD;  Location: [facility];  Service: Endoscopy;;   COLONOSCOPY WITH PROPOFOL  N/A 09/25/2023   Procedure: COLONOSCOPY WITH PROPOFOL ;  Surgeon: Jinny Carmine, MD;  Location: [facility];  Service: Endoscopy;  Laterality: N/A;   ESOPHAGOGASTRODUODENOSCOPY (EGD) WITH PROPOFOL  N/A 09/25/2023   Procedure: ESOPHAGOGASTRODUODENOSCOPY (EGD) WITH PROPOFOL ;  Surgeon: Jinny Carmine, MD;  Location: [facility];  Service: Endoscopy;  Laterality: N/A;   REPAIR EXTENSOR TENDON Right 11/14/2015   Procedure: REPAIR EXTENSOR TENDON, 3rd and 5th;  Surgeon: Norleen JINNY Maltos, MD;  Location:  [facility];  Service: Orthopedics;  Laterality: Right;       Home Medications    Prior to Admission medications  Medication Sig Start Date End Date Taking? Authorizing Provider  bismuth subsalicylate (PEPTO BISMOL) 262 MG/15ML suspension Take 30 mLs by mouth every 6 (six) hours as needed for indigestion.    [provider]  butalbital -acetaminophen -caffeine  (FIORICET) 50-325-40 MG tablet Take 1 tablet by mouth every 6 (six) hours as needed for headache. 05/24/24 05/24/25  Cleaster Tinnie LABOR, PA-C  famotidine  (PEPCID ) 20 MG tablet Take by mouth. 12/14/23   [provider]  fexofenadine -pseudoephedrine  (ALLEGRA-D) 60-120 MG 12 hr tablet Take 1 tablet by mouth every 12 (twelve) hours. 07/11/24   Brimage, Vondra, DO  ipratropium (ATROVENT ) 0.06 % nasal spray Place 2 sprays into both nostrils 4 (four) times daily. 11/03/24   Bernardino Ditch, NP  promethazine -dextromethorphan (PROMETHAZINE -DM) 6.25-15 MG/5ML syrup Take 5 mLs by mouth 4 (four) times daily as needed. 07/05/24   Bernardino Ditch, NP  rosuvastatin  (CRESTOR ) 10 MG tablet Take 1 tablet (10 mg total) by mouth daily. Patient not taking: Reported on 09/03/2023 06/27/23   Gretel App, NP    Family History Family History  Problem Relation Age of Onset   Hypertension Mother    Rheum arthritis Mother    Arthritis Mother    Diabetes Father    Depression Maternal Grandmother    Diabetes Maternal Grandmother    Diabetes Sister     Social History Social History[1]   Allergies  Peanut oil, Penicillins, Shellfish allergy , Shrimp extract, Tomato, Gramineae pollens, Other, Peanut-containing drug products, Wheat, and Amoxicillin   Review of Systems Review of Systems  Constitutional:  Negative for fever.  HENT:  Positive for congestion and sore throat. Negative for ear pain and rhinorrhea.   Respiratory:  Negative for cough.      Physical Exam Triage Vital Signs ED Triage Vitals  Encounter Vitals Group     BP 11/03/24 1348  108/76     Girls Systolic BP Percentile --      Girls Diastolic BP Percentile --      Boys Systolic BP Percentile --      Boys Diastolic BP Percentile --      Pulse Rate 11/03/24 1348 77     Resp 11/03/24 1348 18     Temp 11/03/24 1348 98.7 F (37.1 C)     Temp Source 11/03/24 1348 Oral     SpO2 11/03/24 1348 96 %     Weight 11/03/24 1347 205 lb (93 kg)     Height --      Head Circumference --      Peak Flow --      Pain Score 11/03/24 1347 9     Pain Loc --      Pain Education --      Exclude from Growth Chart --    No data found.  Updated Vital Signs BP 108/76 (BP Location: Right Arm)   Pulse 77   Temp 98.7 F (37.1 C) (Oral)   Resp 18   Wt 205 lb (93 kg)   SpO2 96%   BMI 27.80 kg/m   Visual Acuity Right Eye Distance:   Left Eye Distance:   Bilateral Distance:    Right Eye Near:   Left Eye Near:    Bilateral Near:     Physical Exam Vitals and nursing note reviewed.  Constitutional:      Appearance: Normal appearance. He is not ill-appearing.  HENT:     Head: Normocephalic and atraumatic.     Mouth/Throat:     Mouth: Mucous membranes are moist.     Pharynx: Oropharynx is clear. Posterior oropharyngeal erythema present. No oropharyngeal exudate.     Comments: Tonsillar pillars are 2+ edematous without erythema or exudate. Cardiovascular:     Rate and Rhythm: Normal rate and regular rhythm.     Pulses: Normal pulses.     Heart sounds: Normal heart sounds. No murmur heard.    No friction rub. No gallop.  Pulmonary:     Effort: Pulmonary effort is normal.     Breath sounds: Normal breath sounds. No wheezing, rhonchi or rales.  Musculoskeletal:     Cervical back: Normal range of motion and neck supple. No tenderness.  Lymphadenopathy:     Cervical: No cervical adenopathy.  Skin:    General: Skin is warm and dry.     Capillary Refill: Capillary refill takes less than 2 seconds.     Findings: No erythema or rash.  Neurological:     General: No focal  deficit present.     Mental Status: He is alert and oriented to person, place, and time.      UC Treatments / Results  Labs (all labs ordered are listed, but only abnormal results are displayed) Labs Reviewed  POCT RAPID STREP A (OFFICE) - Normal  CULTURE, GROUP A STREP [facility])    EKG   Radiology No results found.  Procedures Procedures (including critical care time)  Medications Ordered in UC Medications - No data to display  Initial Impression / Assessment and Plan / UC Course  I have reviewed the triage vital signs and the nursing notes.  Pertinent labs & imaging results that were available during my care of the patient were reviewed by me and considered in my medical decision making (see chart for details).   Patient is a nontoxic-appearing 39 year old male presenting for evaluation of a sore throat this been going on for last 2 days.  He denied other respiratory symptoms at triage though when I questioned further he states that he has had some ongoing nasal congestion.  Denies cough.  He has been exposed to influenza at work.  His boss also had a sore throat which turned out to be a sinus infection.  Given the isolated sore throat I will order a rapid strep.  We do not have any influenza test at present so I will not order a flu test at this time.  Rapid strep is negative.  I will send swab for culture.  I will discharge patient with diagnosis of viral URI.  Option of Atrovent  nasal spray for his nasal congestion.  He may gargle with salt water as often as he likes to soothe his throat and use over-the-counter Chloraseptic or Sucrets lozenges along with over-the-counter Tylenol  and/or ibuprofen .  Return precautions reviewed.  School note provided.  Fluid culture came back positive for strep.  Sent a prescription for azithromycin  to your pharmacy.   Final Clinical Impressions(s) / UC Diagnoses   Final diagnoses:  Acute pharyngitis, unspecified etiology  Viral upper  respiratory tract infection     Discharge Instructions      Your strep test was negative but we will send the swab for culture.  I suspect that you have a viral respiratory infection based upon your nasal congestion and sore throat.  It could possibly be influenza but you are not infectious due to the fact that you are not febrile.  Use over-the-counter Tylenol  and or ibuprofen  according to the package instructions as needed for any fever or pain.  Use the Atrovent  nasal spray, 2 squirts up each nostril every 6 hours, as needed for nasal congestion.  You may gargle with warm salt water as often as you like to soothe your throat.  You may also use over-the-counter Chloraseptic or Sucrets lozenges.  No more than 1 lozenge every 2 hours as the menthol may give you diarrhea.  If you develop any new or worsening close please return for reevaluation or see your PCP     ED Prescriptions     Medication Sig Dispense Auth. Provider   ipratropium (ATROVENT ) 0.06 % nasal spray Place 2 sprays into both nostrils 4 (four) times daily. 15 mL Bernardino Ditch, NP      PDMP not reviewed this encounter.    Bernardino Ditch, NP 11/03/24 1408     [1]  Social History Tobacco Use   Smoking status: Never   Smokeless tobacco: Never  Vaping Use   Vaping status: Never Used  Substance Use Topics   Alcohol use: Not Currently    Comment: socially   Drug use: No     Bernardino Ditch, NP 11/04/24 1611  "

## 2024-11-03 NOTE — ED Notes (Signed)
 Called pt from lobby x2. No answer.

## 2024-11-03 NOTE — ED Notes (Signed)
 Called pt from lobby x1. No answer. Patient access able to reach stated coming in.

## 2024-11-04 LAB — CULTURE, GROUP A STREP (THRC)

## 2024-11-04 MED ORDER — AZITHROMYCIN 250 MG PO TABS: 250.0000 mg | ORAL_TABLET | Freq: Every day | ORAL | 0 refills | Status: AC

## 2024-11-05 ENCOUNTER — Ambulatory Visit (HOSPITAL_COMMUNITY): Payer: Self-pay
